# Patient Record
Sex: Female | Born: 1990 | Race: White | Hispanic: Yes | Marital: Single | Smoking: Current every day smoker
Health system: Southern US, Community
[De-identification: ages and names within clinical notes are randomized; demographics above are authoritative.]

## PROBLEM LIST (undated history)

## (undated) ENCOUNTER — Emergency Department (HOSPITAL_BASED_OUTPATIENT_CLINIC_OR_DEPARTMENT_OTHER): Admission: EM | Payer: 59 | Source: Home / Self Care

## (undated) DIAGNOSIS — E0590 Thyrotoxicosis, unspecified without thyrotoxic crisis or storm: Secondary | ICD-10-CM

## (undated) HISTORY — PX: CHOLECYSTECTOMY: SHX55

---
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
## 2010-11-17 LAB — URINALYSIS, ROUTINE W REFLEX MICROSCOPIC
Glucose, UA: NEGATIVE
Ketones, ur: NEGATIVE
Leukocytes, UA: NEGATIVE
Nitrite: NEGATIVE
Protein, ur: NEGATIVE
Specific Gravity, Urine: 1.025
Urobilinogen, UA: 0.2
pH: 6.5

## 2010-11-17 LAB — URINE MICROSCOPIC-ADD ON

## 2010-11-17 LAB — GC/CHLAMYDIA PROBE AMP, GENITAL
Chlamydia, DNA Probe: NEGATIVE
GC Probe Amp, Genital: NEGATIVE

## 2010-11-17 LAB — CBC
HCT: 36.4
Hemoglobin: 12.5
MCHC: 34.3
MCV: 80.1 — ABNORMAL LOW
Platelets: 203
RBC: 4.54
RDW: 14.1 — ABNORMAL HIGH
WBC: 5.5

## 2010-11-17 LAB — WET PREP, GENITAL
Clue Cells Wet Prep HPF POC: NONE SEEN
Trich, Wet Prep: NONE SEEN
Yeast Wet Prep HPF POC: NONE SEEN

## 2012-03-13 ENCOUNTER — Emergency Department (HOSPITAL_COMMUNITY)
Admission: EM | Admit: 2012-03-13 | Discharge: 2012-03-13 | Disposition: A | Discharge status: Home / Self Care | Payer: Self-pay

## 2012-03-13 NOTE — ED Notes (Signed)
Pt seen walking out with friend.

## 2012-03-13 NOTE — ED Notes (Signed)
Unable to locate patient for triage x3 

## 2012-03-13 NOTE — ED Notes (Signed)
No response when called for triage. 

## 2020-10-13 ENCOUNTER — Emergency Department (HOSPITAL_COMMUNITY)
Admission: EM | Admit: 2020-10-13 | Discharge: 2020-10-13 | Disposition: A | Discharge status: Home / Self Care | Payer: 59 | Attending: Emergency Medicine | Admitting: Emergency Medicine

## 2020-10-13 ENCOUNTER — Other Ambulatory Visit: Payer: Self-pay

## 2020-10-13 DIAGNOSIS — Z5321 Procedure and treatment not carried out due to patient leaving prior to being seen by health care provider: Secondary | ICD-10-CM | POA: Insufficient documentation

## 2020-10-13 DIAGNOSIS — K0889 Other specified disorders of teeth and supporting structures: Secondary | ICD-10-CM | POA: Diagnosis not present

## 2020-10-13 NOTE — ED Notes (Addendum)
Patient observed walking to bathroom.  Patient seen walking out of department by another staff member after using bathroom.  Patient did not inform this RN that she was leaving department without being seen

## 2020-10-13 NOTE — ED Triage Notes (Signed)
Pt complains of dental pain. Pt had her left wisdom teeth removed on Friday.

## 2020-12-01 ENCOUNTER — Other Ambulatory Visit: Payer: Self-pay

## 2020-12-01 ENCOUNTER — Ambulatory Visit
Admission: EM | Admit: 2020-12-01 | Discharge: 2020-12-01 | Disposition: A | Discharge status: Home / Self Care | Payer: 59 | Attending: Physician Assistant | Admitting: Physician Assistant

## 2020-12-01 ENCOUNTER — Encounter: Payer: Self-pay | Admitting: Physician Assistant

## 2020-12-01 DIAGNOSIS — J029 Acute pharyngitis, unspecified: Secondary | ICD-10-CM | POA: Diagnosis present

## 2020-12-01 LAB — POCT RAPID STREP A (OFFICE): Rapid Strep A Screen: NEGATIVE

## 2020-12-01 MED ORDER — AMOXICILLIN 500 MG PO CAPS: 500.0000 mg | ORAL_CAPSULE | Freq: Three times a day (TID) | ORAL | 0 refills | Status: DC

## 2020-12-01 NOTE — ED Triage Notes (Signed)
Pt c/o sore throat, swollen tonsils, headaches, fever, and chills x3 days.

## 2020-12-01 NOTE — ED Provider Notes (Signed)
[facility]    CSN: [id number] Arrival date & time: 12/01/20  0957      History   Chief Complaint Chief Complaint  Patient presents with   Sore Throat    HPI Jessica Harrington is a 30 y.o. female.   Patient here today for evaluation of sore throat, fever and chills that started 3 days ago. She denies vomiting or diarrhea. She has had minimal cough. She has tried warm liquids with mild relief.   The history is provided by the patient.  Sore Throat Pertinent negatives include no abdominal pain and no shortness of breath.   History reviewed. No pertinent past medical history.  There are no problems to display for this patient.   History reviewed. No pertinent surgical history.  OB History   No obstetric history on file.      Home Medications    Prior to Admission medications   Medication Sig Start Date End Date Taking? Authorizing Provider  amoxicillin (AMOXIL) 500 MG capsule Take 1 capsule (500 mg total) by mouth 3 (three) times daily. 12/01/20  Yes Tomi Bamberger, PA-C    Family History History reviewed. No pertinent family history.  Social History Social History   Tobacco Use   Smoking status: Every Day    Types: Cigarettes   Smokeless tobacco: Never  Substance Use Topics   Alcohol use: Not Currently   Drug use: Not Currently     Allergies   Patient has no known allergies.   Review of Systems Review of Systems  Constitutional:  Positive for chills and fever.  HENT:  Positive for congestion and sore throat. Negative for ear pain and sinus pressure.   Eyes:  Negative for discharge and redness.  Respiratory:  Positive for cough. Negative for shortness of breath and wheezing.   Gastrointestinal:  Negative for abdominal pain, diarrhea, nausea and vomiting.    Physical Exam Triage Vital Signs ED Triage Vitals [12/01/20 1056]  Enc Vitals Group     BP 125/79     Pulse Rate 81     Resp 18     Temp 99.1 F (37.3 C)     Temp  Source Oral     SpO2 98 %     Weight      Height      Head Circumference      Peak Flow      Pain Score      Pain Loc      Pain Edu?      Excl. in GC?    No data found.  Updated Vital Signs BP 125/79 (BP Location: Left Arm)   Pulse 81   Temp 99.1 F (37.3 C) (Oral)   Resp 18   LMP 12/01/2020   SpO2 98%   Physical Exam Vitals and nursing note reviewed.  Constitutional:      General: She is not in acute distress.    Appearance: Normal appearance. She is not ill-appearing.  HENT:     Head: Normocephalic and atraumatic.     Right Ear: Tympanic membrane normal.     Left Ear: Tympanic membrane normal.     Nose: Congestion present.     Mouth/Throat:     Mouth: Mucous membranes are moist.     Pharynx: Oropharyngeal exudate and posterior oropharyngeal erythema present.  Eyes:     Conjunctiva/sclera: Conjunctivae normal.  Cardiovascular:     Rate and Rhythm: Normal rate and regular rhythm.     Heart sounds: Normal  heart sounds. No murmur heard. Pulmonary:     Effort: Pulmonary effort is normal. No respiratory distress.     Breath sounds: Normal breath sounds. No wheezing, rhonchi or rales.  Skin:    General: Skin is warm and dry.  Neurological:     Mental Status: She is alert.  Psychiatric:        Mood and Affect: Mood normal.        Thought Content: Thought content normal.     UC Treatments / Results  Labs (all labs ordered are listed, but only abnormal results are displayed) Labs Reviewed  COVID-19, FLU A+B NAA  CULTURE, GROUP A STREP [facility])  POCT RAPID STREP A (OFFICE)    EKG   Radiology No results found.  Procedures Procedures (including critical care time)  Medications Ordered in UC Medications - No data to display  Initial Impression / Assessment and Plan / UC Course  I have reviewed the triage vital signs and the nursing notes.  Pertinent labs & imaging results that were available during my care of the patient were reviewed by me and  considered in my medical decision making (see chart for details).   Rapid strep negative but given exudate will treat with antibiotics. Screening ordered for flu and covid as well. Recommend follow up with any further concerns. Will await results for further recommendation.   Final Clinical Impressions(s) / UC Diagnoses   Final diagnoses:  Acute pharyngitis, unspecified etiology   Discharge Instructions   None    ED Prescriptions     Medication Sig Dispense Auth. Provider   amoxicillin (AMOXIL) 500 MG capsule Take 1 capsule (500 mg total) by mouth 3 (three) times daily. 21 capsule Tomi Bamberger, PA-C      PDMP not reviewed this encounter.   Tomi Bamberger, PA-C 12/01/20 1138

## 2020-12-03 LAB — COVID-19, FLU A+B NAA
Influenza A, NAA: NOT DETECTED
Influenza B, NAA: NOT DETECTED
SARS-CoV-2, NAA: NOT DETECTED

## 2020-12-04 LAB — CULTURE, GROUP A STREP (THRC)

## 2020-12-20 ENCOUNTER — Telehealth: Payer: Self-pay | Admitting: Emergency Medicine

## 2020-12-20 ENCOUNTER — Ambulatory Visit: Payer: 59

## 2020-12-20 NOTE — Telephone Encounter (Signed)
Call to pt to see how low her BP was  ( systolic was 132). Pt states nipples are sore & feels dizzy & nauseated. LMP was 12/01/20. Pt plans on doing an at home pregnancy test today. RN asked pt to come in earlier if able to in case she needs to go to the hospital.

## 2021-02-11 DIAGNOSIS — B379 Candidiasis, unspecified: Secondary | ICD-10-CM | POA: Diagnosis not present

## 2021-02-11 DIAGNOSIS — R350 Frequency of micturition: Secondary | ICD-10-CM | POA: Diagnosis not present

## 2021-04-24 DIAGNOSIS — N898 Other specified noninflammatory disorders of vagina: Secondary | ICD-10-CM | POA: Diagnosis not present

## 2021-04-24 DIAGNOSIS — R350 Frequency of micturition: Secondary | ICD-10-CM | POA: Diagnosis not present

## 2021-04-24 DIAGNOSIS — N730 Acute parametritis and pelvic cellulitis: Secondary | ICD-10-CM | POA: Diagnosis not present

## 2021-06-05 ENCOUNTER — Other Ambulatory Visit: Payer: Self-pay

## 2021-06-05 ENCOUNTER — Emergency Department (HOSPITAL_BASED_OUTPATIENT_CLINIC_OR_DEPARTMENT_OTHER)
Admission: EM | Admit: 2021-06-05 | Discharge: 2021-06-06 | Disposition: A | Discharge status: Home / Self Care | Payer: 59 | Attending: Emergency Medicine | Admitting: Emergency Medicine

## 2021-06-05 ENCOUNTER — Encounter (HOSPITAL_BASED_OUTPATIENT_CLINIC_OR_DEPARTMENT_OTHER): Payer: Self-pay

## 2021-06-05 DIAGNOSIS — M791 Myalgia, unspecified site: Secondary | ICD-10-CM | POA: Diagnosis not present

## 2021-06-05 DIAGNOSIS — U071 COVID-19: Secondary | ICD-10-CM | POA: Diagnosis not present

## 2021-06-05 LAB — CBC WITH DIFFERENTIAL/PLATELET
Abs Immature Granulocytes: 0.01 10*3/uL (ref 0.00–0.07)
Basophils Absolute: 0 10*3/uL (ref 0.0–0.1)
Basophils Relative: 1 %
Eosinophils Absolute: 0 10*3/uL (ref 0.0–0.5)
Eosinophils Relative: 1 %
HCT: 42.7 % (ref 36.0–46.0)
Hemoglobin: 13.8 g/dL (ref 12.0–15.0)
Immature Granulocytes: 0 %
Lymphocytes Relative: 19 %
Lymphs Abs: 0.8 10*3/uL (ref 0.7–4.0)
MCH: 27.2 pg (ref 26.0–34.0)
MCHC: 32.3 g/dL (ref 30.0–36.0)
MCV: 84.1 fL (ref 80.0–100.0)
Monocytes Absolute: 0.5 10*3/uL (ref 0.1–1.0)
Monocytes Relative: 13 %
Neutro Abs: 2.7 10*3/uL (ref 1.7–7.7)
Neutrophils Relative %: 66 %
Platelets: 236 10*3/uL (ref 150–400)
RBC: 5.08 MIL/uL (ref 3.87–5.11)
RDW: 12.8 % (ref 11.5–15.5)
WBC: 4 10*3/uL (ref 4.0–10.5)
nRBC: 0 % (ref 0.0–0.2)

## 2021-06-05 LAB — URINALYSIS, ROUTINE W REFLEX MICROSCOPIC
Bilirubin Urine: NEGATIVE
Glucose, UA: NEGATIVE mg/dL
Ketones, ur: NEGATIVE mg/dL
Leukocytes,Ua: NEGATIVE
Nitrite: NEGATIVE
Protein, ur: NEGATIVE mg/dL
Specific Gravity, Urine: 1.016 (ref 1.005–1.030)
pH: 5 (ref 5.0–8.0)

## 2021-06-05 LAB — RESP PANEL BY RT-PCR (FLU A&B, COVID) ARPGX2
Influenza A by PCR: NEGATIVE
Influenza B by PCR: NEGATIVE
SARS Coronavirus 2 by RT PCR: POSITIVE — AB

## 2021-06-05 LAB — COMPREHENSIVE METABOLIC PANEL
ALT: 28 U/L (ref 0–44)
AST: 23 U/L (ref 15–41)
Albumin: 4.6 g/dL (ref 3.5–5.0)
Alkaline Phosphatase: 64 U/L (ref 38–126)
Anion gap: 10 (ref 5–15)
BUN: 16 mg/dL (ref 6–20)
CO2: 26 mmol/L (ref 22–32)
Calcium: 10.1 mg/dL (ref 8.9–10.3)
Chloride: 100 mmol/L (ref 98–111)
Creatinine, Ser: 1 mg/dL (ref 0.44–1.00)
GFR, Estimated: >60 mL/min (ref >60)
Glucose, Bld: 100 mg/dL — ABNORMAL HIGH (ref 70–99)
Potassium: 3.7 mmol/L (ref 3.5–5.1)
Sodium: 136 mmol/L (ref 135–145)
Total Bilirubin: 0.3 mg/dL (ref 0.3–1.2)
Total Protein: 7.9 g/dL (ref 6.5–8.1)

## 2021-06-05 LAB — LACTIC ACID, PLASMA: Lactic Acid, Venous: 0.6 mmol/L (ref 0.5–1.9)

## 2021-06-05 LAB — PREGNANCY, URINE: Preg Test, Ur: NEGATIVE

## 2021-06-05 MED ORDER — ACETAMINOPHEN 325 MG PO TABS
650.0000 mg | ORAL_TABLET | Freq: Once | ORAL | Status: AC
  Administered 2021-06-06: 650 mg via ORAL
  Filled 2021-06-05: qty 2

## 2021-06-05 NOTE — ED Triage Notes (Signed)
Presented via triage with c/o HA, generalized body aches and weakness x2 days.  ? ?Denies n/v ?

## 2021-06-05 NOTE — ED Provider Notes (Signed)
? ?  [place] [facility]  ?Provider Note ? ?CSN: [id number] ?Arrival date & time: 06/05/21 2037 ? ?History ?Chief Complaint  ?Patient presents with  ? Generalized Body Aches  ? ? ?Jessica Harrington is a 31 y.o. female with no PMH reports 2 days of body aches, chills, headaches but no cough, SOB, fever or vomiting.  ? ? ?Home Medications ?Prior to Admission medications   ?Medication Sig Start Date End Date Taking? Authorizing Provider  ?amoxicillin (AMOXIL) 500 MG capsule Take 1 capsule (500 mg total) by mouth 3 (three) times daily. 12/01/20   Francene Finders, PA-C  ? ? ? ?Allergies    ?Patient has no known allergies. ? ? ?Review of Systems   ?Review of Systems ?Please see HPI for pertinent positives and negatives ? ?Physical Exam ?BP 99/76   Pulse 80   Temp 98.7 ?F (37.1 ?C) (Oral)   Resp 18   Ht 5\' 6"  (1.676 m)   Wt 104.3 kg   SpO2 99%   BMI 37.12 kg/m?  ? ?Physical Exam ?Vitals and nursing note reviewed.  ?HENT:  ?   Head: Normocephalic.  ?   Nose: Nose normal.  ?Eyes:  ?   Extraocular Movements: Extraocular movements intact.  ?Pulmonary:  ?   Effort: Pulmonary effort is normal.  ?Musculoskeletal:     ?   General: Normal range of motion.  ?   Cervical back: Neck supple.  ?Skin: ?   Findings: No rash (on exposed skin).  ?Neurological:  ?   Mental Status: She is alert and oriented to person, place, and time.  ?Psychiatric:     ?   Mood and Affect: Mood normal.  ? ? ?ED Results / Procedures / Treatments   ?EKG ?None ? ?Procedures ?Procedures ? ?Medications Ordered in the ED ?Medications  ?acetaminophen (TYLENOL) tablet 650 mg (has no administration in time range)  ? ? ?Initial Impression and Plan ? Patient well appearing with viral syndrome symptoms, labs done in triage show normal CBC, BMP, UA and lactic acid. Covid is positive. She does not have any high risk medical problems. Not likely to benefit from Paxlovid. Normal respiratory status without hypoxia, does not meet criteria for  admission. Recommend supportive treatment at home. APAP/Motrin for aches and pains. Oral Hydration, PCP follow up. Quarantine instructions given.  ? ?ED Course  ? ?  ? ? ?MDM Rules/Calculators/A&P ?Medical Decision Making ?Problems Addressed: ?COVID: acute illness or injury ? ?Amount and/or Complexity of Data Reviewed ?Labs: ordered. Decision-making details documented in ED Course. ? ?Risk ?OTC drugs. ?Decision regarding hospitalization. ? ? ? ?Final Clinical Impression(s) / ED Diagnoses ?Final diagnoses:  ?COVID  ? ? ?Rx / DC Orders ?ED Discharge Orders   ? ? None  ? ?  ? ?  ?Truddie Hidden, MD ?06/05/21 2342 ? ?

## 2021-06-06 NOTE — ED Notes (Signed)
Pt agreeable with d/c plan as discussed by provider-this nurse has verbally reinforced d/c instructions and provided pt with written copy- pt acknowledges verbal understanding and denies any additional questions, concerns, needs- pt ambulatory with steady gait at discharge; no distress; vitals stable  ?

## 2021-06-08 DIAGNOSIS — Z20822 Contact with and (suspected) exposure to covid-19: Secondary | ICD-10-CM | POA: Diagnosis not present

## 2021-06-10 DIAGNOSIS — Z20822 Contact with and (suspected) exposure to covid-19: Secondary | ICD-10-CM | POA: Diagnosis not present

## 2021-06-16 ENCOUNTER — Other Ambulatory Visit (HOSPITAL_BASED_OUTPATIENT_CLINIC_OR_DEPARTMENT_OTHER): Payer: Self-pay

## 2021-06-16 ENCOUNTER — Emergency Department (HOSPITAL_BASED_OUTPATIENT_CLINIC_OR_DEPARTMENT_OTHER)
Admission: EM | Admit: 2021-06-16 | Discharge: 2021-06-16 | Disposition: A | Discharge status: Home / Self Care | Payer: 59 | Attending: Emergency Medicine | Admitting: Emergency Medicine

## 2021-06-16 ENCOUNTER — Encounter (HOSPITAL_BASED_OUTPATIENT_CLINIC_OR_DEPARTMENT_OTHER): Payer: Self-pay | Admitting: Emergency Medicine

## 2021-06-16 ENCOUNTER — Other Ambulatory Visit: Payer: Self-pay

## 2021-06-16 ENCOUNTER — Emergency Department (HOSPITAL_BASED_OUTPATIENT_CLINIC_OR_DEPARTMENT_OTHER): Payer: 59

## 2021-06-16 DIAGNOSIS — R197 Diarrhea, unspecified: Secondary | ICD-10-CM | POA: Diagnosis not present

## 2021-06-16 DIAGNOSIS — K529 Noninfective gastroenteritis and colitis, unspecified: Secondary | ICD-10-CM

## 2021-06-16 DIAGNOSIS — N9489 Other specified conditions associated with female genital organs and menstrual cycle: Secondary | ICD-10-CM | POA: Diagnosis not present

## 2021-06-16 DIAGNOSIS — R519 Headache, unspecified: Secondary | ICD-10-CM | POA: Insufficient documentation

## 2021-06-16 DIAGNOSIS — R112 Nausea with vomiting, unspecified: Secondary | ICD-10-CM | POA: Diagnosis not present

## 2021-06-16 DIAGNOSIS — R109 Unspecified abdominal pain: Secondary | ICD-10-CM | POA: Diagnosis not present

## 2021-06-16 LAB — CBC WITH DIFFERENTIAL/PLATELET
Abs Immature Granulocytes: 0.02 10*3/uL (ref 0.00–0.07)
Basophils Absolute: 0 10*3/uL (ref 0.0–0.1)
Basophils Relative: 0 %
Eosinophils Absolute: 0.1 10*3/uL (ref 0.0–0.5)
Eosinophils Relative: 1 %
HCT: 41.9 % (ref 36.0–46.0)
Hemoglobin: 13.5 g/dL (ref 12.0–15.0)
Immature Granulocytes: 0 %
Lymphocytes Relative: 8 %
Lymphs Abs: 0.6 10*3/uL — ABNORMAL LOW (ref 0.7–4.0)
MCH: 26.7 pg (ref 26.0–34.0)
MCHC: 32.2 g/dL (ref 30.0–36.0)
MCV: 82.8 fL (ref 80.0–100.0)
Monocytes Absolute: 0.2 10*3/uL (ref 0.1–1.0)
Monocytes Relative: 3 %
Neutro Abs: 6.4 10*3/uL (ref 1.7–7.7)
Neutrophils Relative %: 88 %
Platelets: 221 10*3/uL (ref 150–400)
RBC: 5.06 MIL/uL (ref 3.87–5.11)
RDW: 12.6 % (ref 11.5–15.5)
WBC: 7.3 10*3/uL (ref 4.0–10.5)
nRBC: 0 % (ref 0.0–0.2)

## 2021-06-16 LAB — HCG, SERUM, QUALITATIVE: Preg, Serum: NEGATIVE

## 2021-06-16 LAB — URINALYSIS, ROUTINE W REFLEX MICROSCOPIC
Bilirubin Urine: NEGATIVE
Glucose, UA: NEGATIVE mg/dL
Leukocytes,Ua: NEGATIVE
Nitrite: NEGATIVE
Specific Gravity, Urine: 1.033 — ABNORMAL HIGH (ref 1.005–1.030)
pH: 5.5 (ref 5.0–8.0)

## 2021-06-16 LAB — COMPREHENSIVE METABOLIC PANEL
ALT: 36 U/L (ref 0–44)
AST: 26 U/L (ref 15–41)
Albumin: 4 g/dL (ref 3.5–5.0)
Alkaline Phosphatase: 55 U/L (ref 38–126)
Anion gap: 8 (ref 5–15)
BUN: 16 mg/dL (ref 6–20)
CO2: 26 mmol/L (ref 22–32)
Calcium: 8.9 mg/dL (ref 8.9–10.3)
Chloride: 103 mmol/L (ref 98–111)
Creatinine, Ser: 1.04 mg/dL — ABNORMAL HIGH (ref 0.44–1.00)
GFR, Estimated: >60 mL/min (ref >60)
Glucose, Bld: 97 mg/dL (ref 70–99)
Potassium: 3.9 mmol/L (ref 3.5–5.1)
Sodium: 137 mmol/L (ref 135–145)
Total Bilirubin: 0.8 mg/dL (ref 0.3–1.2)
Total Protein: 6.8 g/dL (ref 6.5–8.1)

## 2021-06-16 LAB — LIPASE, BLOOD: Lipase: 11 U/L (ref 11–51)

## 2021-06-16 MED ORDER — METOCLOPRAMIDE HCL 5 MG/ML IJ SOLN
10.0000 mg | Freq: Once | INTRAMUSCULAR | Status: AC
  Administered 2021-06-16: 10 mg via INTRAVENOUS
  Filled 2021-06-16: qty 2

## 2021-06-16 MED ORDER — PANTOPRAZOLE SODIUM 40 MG IV SOLR
40.0000 mg | Freq: Once | INTRAVENOUS | Status: AC
  Administered 2021-06-16: 40 mg via INTRAVENOUS
  Filled 2021-06-16: qty 10

## 2021-06-16 MED ORDER — ACETAMINOPHEN 325 MG PO TABS
650.0000 mg | ORAL_TABLET | Freq: Once | ORAL | Status: AC
  Administered 2021-06-16: 650 mg via ORAL
  Filled 2021-06-16: qty 2

## 2021-06-16 MED ORDER — SODIUM CHLORIDE 0.9 % IV BOLUS
1000.0000 mL | Freq: Once | INTRAVENOUS | Status: AC
  Administered 2021-06-16: 1000 mL via INTRAVENOUS

## 2021-06-16 MED ORDER — SODIUM CHLORIDE 0.9 % IV SOLN: INTRAVENOUS | Status: DC

## 2021-06-16 MED ORDER — DEXAMETHASONE SODIUM PHOSPHATE 10 MG/ML IJ SOLN
10.0000 mg | Freq: Once | INTRAMUSCULAR | Status: AC
  Administered 2021-06-16: 10 mg via INTRAVENOUS
  Filled 2021-06-16: qty 1

## 2021-06-16 MED ORDER — LACTATED RINGERS IV BOLUS
1000.0000 mL | Freq: Once | INTRAVENOUS | Status: AC
  Administered 2021-06-16: 1000 mL via INTRAVENOUS

## 2021-06-16 MED ORDER — ONDANSETRON HCL 4 MG/2ML IJ SOLN
4.0000 mg | Freq: Once | INTRAMUSCULAR | Status: AC
  Administered 2021-06-16: 4 mg via INTRAVENOUS
  Filled 2021-06-16: qty 2

## 2021-06-16 MED ORDER — DIPHENHYDRAMINE HCL 50 MG/ML IJ SOLN
25.0000 mg | Freq: Once | INTRAMUSCULAR | Status: AC
  Administered 2021-06-16: 25 mg via INTRAVENOUS
  Filled 2021-06-16: qty 1

## 2021-06-16 MED ORDER — ONDANSETRON 4 MG PO TBDP
4.0000 mg | ORAL_TABLET | Freq: Three times a day (TID) | ORAL | 0 refills | Status: DC | PRN
  Filled 2021-06-16: qty 14, 5d supply, fill #0

## 2021-06-16 MED ORDER — ONDANSETRON 4 MG PO TBDP
4.0000 mg | ORAL_TABLET | Freq: Once | ORAL | Status: AC
  Administered 2021-06-16: 4 mg via ORAL
  Filled 2021-06-16: qty 1

## 2021-06-16 MED ORDER — IOHEXOL 300 MG/ML  SOLN
100.0000 mL | Freq: Once | INTRAMUSCULAR | Status: AC | PRN
  Administered 2021-06-16: 100 mL via INTRAVENOUS

## 2021-06-16 NOTE — ED Provider Notes (Addendum)
Patient turned over to me.  Patient's labs very reassuring.  Urine was a little concentrated.  Patient is already received 1 L of fluid.  Urinalysis not consistent with urinary tract infection.  Complete metabolic panel normal GFR greater than 60 no anion gap lipase normal liver function test normal white blood cell count 7.3 hemoglobin 13.5 regnancy test negative. ? ?Since I have taken over patient's care she has had additional multiple complaints says that she has had a headache for 3 days that is not typical for her migraines.  It was better yesterday but it came back worse when she started with the nausea vomiting and diarrhea last evening at around 1800.  Patient still complaining of generalized abdominal pain all over that is severe at times. ? ?Clinically this seems to be consistent with a gastroenteritis type viral illness.  But patient is insistent. ? ?Based on this we will give another liter of fluid we will give a migraine cocktail with Decadron Benadryl and Reglan.  And we will go ahead and CT her head abdomen and pelvis. ?  ?Fredia Sorrow, MD ?06/16/21 0912 ? ? ?CT head CT abdomen pelvis without any acute findings.  Patient feeling better with the migraine cocktail.  Will discharge home with antinausea medicine.  Patient does not need work note.  Clinically still feel that this is a viral gastroenteritis. ?  ?Fredia Sorrow, MD ?06/16/21 1037 ? ?

## 2021-06-16 NOTE — ED Provider Notes (Signed)
? ?[facility]  ?Provider Note ? ?CSN: [id number] ?Arrival date & time: 06/16/21 0240 ? ?History ?Chief Complaint  ?Patient presents with  ?? Emesis  ? ? ?Jessica Harrington is a 31 y.o. female with no significant PMH, has had cholecystectomy, reports she has had nausea, vomiting, diarrhea and left sided abdominal burning since eating some fish last night for dinner. Husband had some GI symptoms last week. She denies fever but had some chills. Feeling weak.  ? ? ?Home Medications ?Prior to Admission medications   ?Not on File  ? ? ? ?Allergies    ?Patient has no known allergies. ? ? ?Review of Systems   ?Review of Systems ?Please see HPI for pertinent positives and negatives ? ?Physical Exam ?BP 119/62   Pulse 84   Temp 98.6 ?F (37 ?C)   Resp 18   Ht 5\' 6"  (1.676 m)   Wt 100.7 kg   SpO2 99%   BMI 35.83 kg/m?  ? ?Physical Exam ?Vitals and nursing note reviewed.  ?Constitutional:   ?   Appearance: Normal appearance.  ?HENT:  ?   Head: Normocephalic and atraumatic.  ?   Nose: Nose normal.  ?   Mouth/Throat:  ?   Mouth: Mucous membranes are moist.  ?Eyes:  ?   Extraocular Movements: Extraocular movements intact.  ?   Conjunctiva/sclera: Conjunctivae normal.  ?Cardiovascular:  ?   Rate and Rhythm: Normal rate.  ?Pulmonary:  ?   Effort: Pulmonary effort is normal.  ?   Breath sounds: Normal breath sounds.  ?Abdominal:  ?   General: Abdomen is flat.  ?   Palpations: Abdomen is soft. There is no mass.  ?   Tenderness: There is no abdominal tenderness. There is no guarding.  ?Musculoskeletal:     ?   General: No swelling. Normal range of motion.  ?   Cervical back: Neck supple.  ?Skin: ?   General: Skin is warm and dry.  ?Neurological:  ?   General: No focal deficit present.  ?   Mental Status: She is alert.  ?Psychiatric:     ?   Mood and Affect: Mood normal.  ? ? ?ED Results / Procedures / Treatments   ?EKG ?None ? ?Procedures ?Procedures ? ?Medications Ordered in the ED ?Medications   ?lactated ringers bolus 1,000 mL (has no administration in time range)  ?ondansetron (ZOFRAN) injection 4 mg (has no administration in time range)  ?pantoprazole (PROTONIX) injection 40 mg (has no administration in time range)  ?ondansetron (ZOFRAN-ODT) disintegrating tablet 4 mg (4 mg Oral Given 06/16/21 08/16/21)  ? ? ?Initial Impression and Plan ? Patient with N/V/D, she is unsure if she's pregnant but doesn't think so. She has normal vitals and a benign abdomen. Will try a ODT Zofran with PO fluids, if she does not tolerate, may need IVF.  ? ?ED Course  ? ?Clinical Course as of 06/16/21 0705  ?Tue Jun 16, 2021  ?Jun 18, 2021 Patient still having significant nausea. Will order labs, place IV for fluids and care will be signed out to the oncoming team at shift change. [CS]  ?3299 CBC is normal.  [CS]  ?  ?Clinical Course User Index ?[CS] 2426, MD  ? ? ? ?MDM Rules/Calculators/A&P ?Medical Decision Making ?Problems Addressed: ?Nausea vomiting and diarrhea: acute illness or injury ? ?Amount and/or Complexity of Data Reviewed ?Labs: ordered. ? ?Risk ?Prescription drug management. ? ? ? ?Final Clinical Impression(s) / ED Diagnoses ?Final diagnoses:  ?Nausea vomiting  and diarrhea  ? ? ?Rx / DC Orders ?ED Discharge Orders   ? ? None  ? ?  ? ?  ?Pollyann Savoy, MD ?06/16/21 [phone] ? ?

## 2021-06-16 NOTE — ED Triage Notes (Signed)
Pt states that she thinks she ate something bad and has been having abdominal pain, N/V/D since.  ?

## 2021-06-16 NOTE — Discharge Instructions (Signed)
Would recommend just clear liquids today rest in a dark place.  Take the Zofran as needed for nausea and vomiting.  Once tolerating clear liquids well and liquids can have sugar in them would advance to a bland diet.  Return for any new or worse symptoms.  Would expect significant improvement by tomorrow. ?

## 2021-06-16 NOTE — ED Notes (Signed)
Pt states that she is unable to give a urine sample at this time 

## 2021-06-16 NOTE — ED Notes (Signed)
Discharge instructions, follow up care, and prescriptions reviewed and explained, pt verbalized understanding. Pt caox4 and ambulatory on departure.  

## 2021-07-16 ENCOUNTER — Ambulatory Visit (HOSPITAL_BASED_OUTPATIENT_CLINIC_OR_DEPARTMENT_OTHER): Payer: Self-pay | Admitting: Nurse Practitioner

## 2021-07-17 ENCOUNTER — Other Ambulatory Visit: Payer: Self-pay

## 2021-07-17 NOTE — ED Notes (Signed)
Pt called again no answer 

## 2021-08-24 DIAGNOSIS — L309 Dermatitis, unspecified: Secondary | ICD-10-CM | POA: Diagnosis not present

## 2021-08-28 ENCOUNTER — Ambulatory Visit (HOSPITAL_BASED_OUTPATIENT_CLINIC_OR_DEPARTMENT_OTHER): Payer: 59 | Admitting: Nurse Practitioner

## 2021-09-01 ENCOUNTER — Encounter (HOSPITAL_BASED_OUTPATIENT_CLINIC_OR_DEPARTMENT_OTHER): Payer: Self-pay | Admitting: Nurse Practitioner

## 2021-09-01 DIAGNOSIS — J039 Acute tonsillitis, unspecified: Secondary | ICD-10-CM | POA: Diagnosis not present

## 2021-12-02 ENCOUNTER — Emergency Department (HOSPITAL_BASED_OUTPATIENT_CLINIC_OR_DEPARTMENT_OTHER)
Admission: EM | Admit: 2021-12-02 | Discharge: 2021-12-02 | Disposition: A | Discharge status: Home / Self Care | Payer: Medicaid Other | Attending: Emergency Medicine | Admitting: Emergency Medicine

## 2021-12-02 ENCOUNTER — Encounter (HOSPITAL_BASED_OUTPATIENT_CLINIC_OR_DEPARTMENT_OTHER): Payer: Self-pay

## 2021-12-02 ENCOUNTER — Other Ambulatory Visit: Payer: Self-pay

## 2021-12-02 ENCOUNTER — Emergency Department (HOSPITAL_BASED_OUTPATIENT_CLINIC_OR_DEPARTMENT_OTHER): Payer: Medicaid Other

## 2021-12-02 DIAGNOSIS — O039 Complete or unspecified spontaneous abortion without complication: Secondary | ICD-10-CM | POA: Diagnosis present

## 2021-12-02 HISTORY — DX: Thyrotoxicosis, unspecified without thyrotoxic crisis or storm: E05.90

## 2021-12-02 LAB — CBC WITH DIFFERENTIAL/PLATELET
Abs Immature Granulocytes: 0.03 10*3/uL (ref 0.00–0.07)
Basophils Absolute: 0 10*3/uL (ref 0.0–0.1)
Basophils Relative: 0 %
Eosinophils Absolute: 0.1 10*3/uL (ref 0.0–0.5)
Eosinophils Relative: 1 %
HCT: 40.9 % (ref 36.0–46.0)
Hemoglobin: 13.1 g/dL (ref 12.0–15.0)
Immature Granulocytes: 0 %
Lymphocytes Relative: 18 %
Lymphs Abs: 1.3 10*3/uL (ref 0.7–4.0)
MCH: 27.3 pg (ref 26.0–34.0)
MCHC: 32 g/dL (ref 30.0–36.0)
MCV: 85.2 fL (ref 80.0–100.0)
Monocytes Absolute: 0.4 10*3/uL (ref 0.1–1.0)
Monocytes Relative: 6 %
Neutro Abs: 5.2 10*3/uL (ref 1.7–7.7)
Neutrophils Relative %: 75 %
Platelets: 260 10*3/uL (ref 150–400)
RBC: 4.8 MIL/uL (ref 3.87–5.11)
RDW: 12.8 % (ref 11.5–15.5)
WBC: 7 10*3/uL (ref 4.0–10.5)
nRBC: 0 % (ref 0.0–0.2)

## 2021-12-02 LAB — COMPREHENSIVE METABOLIC PANEL
ALT: 17 U/L (ref 0–44)
AST: 14 U/L — ABNORMAL LOW (ref 15–41)
Albumin: 4 g/dL (ref 3.5–5.0)
Alkaline Phosphatase: 62 U/L (ref 38–126)
Anion gap: 9 (ref 5–15)
BUN: 10 mg/dL (ref 6–20)
CO2: 25 mmol/L (ref 22–32)
Calcium: 9.2 mg/dL (ref 8.9–10.3)
Chloride: 101 mmol/L (ref 98–111)
Creatinine, Ser: 0.93 mg/dL (ref 0.44–1.00)
GFR, Estimated: >60 mL/min (ref >60)
Glucose, Bld: 109 mg/dL — ABNORMAL HIGH (ref 70–99)
Potassium: 3.8 mmol/L (ref 3.5–5.1)
Sodium: 135 mmol/L (ref 135–145)
Total Bilirubin: 0.2 mg/dL — ABNORMAL LOW (ref 0.3–1.2)
Total Protein: 7.1 g/dL (ref 6.5–8.1)

## 2021-12-02 LAB — WET PREP, GENITAL
Clue Cells Wet Prep HPF POC: NONE SEEN
Sperm: NONE SEEN
Trich, Wet Prep: NONE SEEN
WBC, Wet Prep HPF POC: <10 (ref <10)
Yeast Wet Prep HPF POC: NONE SEEN

## 2021-12-02 LAB — ABO/RH: ABO/RH(D): O POS

## 2021-12-02 LAB — HCG, QUANTITATIVE, PREGNANCY: hCG, Beta Chain, Quant, S: 68300 m[IU]/mL — ABNORMAL HIGH (ref <5)

## 2021-12-02 MED ORDER — ONDANSETRON 4 MG PO TBDP
4.0000 mg | ORAL_TABLET | Freq: Once | ORAL | Status: AC
  Administered 2021-12-02: 4 mg via ORAL
  Filled 2021-12-02: qty 1

## 2021-12-02 MED ORDER — ACETAMINOPHEN 500 MG PO TABS
1000.0000 mg | ORAL_TABLET | Freq: Once | ORAL | Status: AC
  Administered 2021-12-02: 1000 mg via ORAL
  Filled 2021-12-02: qty 2

## 2021-12-02 NOTE — ED Provider Notes (Signed)
[facility] Provider Note   CSN: [id number] Arrival date & time: 12/02/21  1814     History  Chief Complaint  Patient presents with   Vaginal Bleeding    Jessica Harrington is a 31 y.o. female.  HPI 31 year old female presents with concern for vaginal bleeding during pregnancy.  She is concerned she is having a miscarriage.  She is approximately 9-[redacted] weeks pregnant though does not know her LMP.  She has been feeling "weird" since finding out she is pregnant but now starting to have lower abdominal pain and today started having bleeding with clots and then about 30 minutes prior to me seeing her noticed tissue come out.  She states it looks like when she had a miscarriage before.  She still having some lower abdominal pain.  She had some chills today as well.  Home Medications Prior to Admission medications   Medication Sig Start Date End Date Taking? Authorizing Provider  ondansetron (ZOFRAN-ODT) 4 MG disintegrating tablet Dissolve 1 tablet under the tongue every 8 (eight) hours as needed for nausea or vomiting. 06/16/21   Fredia Sorrow, MD  phentermine (ADIPEX-P) 37.5 MG tablet Take 37.5 mg by mouth daily. 05/31/21   [provider]      Allergies    Patient has no known allergies.    Review of Systems   Review of Systems  Constitutional:  Positive for chills. Negative for fever.  Gastrointestinal:  Positive for abdominal pain.  Genitourinary:  Positive for vaginal bleeding.    Physical Exam Updated Vital Signs BP (!) 101/51 (BP Location: Right Arm)   Pulse 68   Temp 98.8 F (37.1 C) (Oral)   Resp 16   Ht 5\' 6"  (1.676 m)   Wt 95.7 kg   LMP 10/16/2021   SpO2 99%   BMI 34.06 kg/m  Physical Exam Vitals and nursing note reviewed. Exam conducted with a chaperone present.  Constitutional:      General: She is not in acute distress.    Appearance: She is well-developed. She is not ill-appearing or diaphoretic.  HENT:     Head:  Normocephalic and atraumatic.  Cardiovascular:     Rate and Rhythm: Normal rate and regular rhythm.     Heart sounds: Normal heart sounds.  Pulmonary:     Effort: Pulmonary effort is normal.     Breath sounds: Normal breath sounds.  Abdominal:     Palpations: Abdomen is soft.     Tenderness: There is abdominal tenderness in the right lower quadrant, suprapubic area and left lower quadrant.  Genitourinary:    Comments: No obvious masses/fetal tissue.  There is a moderate amount of blood without significant blood clots.  Unable to get a good view of the cervix. Skin:    General: Skin is warm and dry.  Neurological:     Mental Status: She is alert.     ED Results / Procedures / Treatments   Labs (all labs ordered are listed, but only abnormal results are displayed) Labs Reviewed  HCG, QUANTITATIVE, PREGNANCY - Abnormal; Notable for the following components:      Result Value   hCG, Beta Chain, Quant, S 68,300 (*)    All other components within normal limits  COMPREHENSIVE METABOLIC PANEL - Abnormal; Notable for the following components:   Glucose, Bld 109 (*)    AST 14 (*)    Total Bilirubin 0.2 (*)    All other components within normal limits  WET PREP, GENITAL  CBC  WITH DIFFERENTIAL/PLATELET  ABO/RH  GC/CHLAMYDIA PROBE AMP ([facility]) NOT AT [facility]    EKG None  Radiology US OB LESS THAN 14 WEEKS WITH OB TRANSVAGINAL  Result Date: 12/02/2021 CLINICAL DATA:  Vaginal bleeding positive pregnancy test EXAM: OBSTETRIC <14 WK [place] AND TRANSVAGINAL OB US TECHNIQUE: Both transabdominal and transvaginal ultrasound examinations were performed for complete evaluation of the gestation as well as the maternal uterus, adnexal regions, and pelvic cul-de-sac. Transvaginal technique was performed to assess early pregnancy. COMPARISON:  None Available. FINDINGS: Intrauterine gestational sac: Absent Maternal uterus/adnexae: Ovaries are within normal limits. Uterus demonstrates some  heterogeneity of the endometrium in the lower uterine segment. This also demonstrates increased vascularity. Mild free fluid is noted. IMPRESSION: No evidence of intrauterine or extrauterine gestational sac. This may represent a very early pregnancy. Correlation with beta HCG levels is recommended. The need for repeat ultrasound can be determined on a clinical basis. Heterogeneous somewhat hypervascular endometrium in the lower uterine segment just above the cervix. This is of uncertain etiology. Electronically Signed   By: Alcide Clever M.D.   On: 12/02/2021 23:14    Procedures Procedures    Medications Ordered in ED Medications  acetaminophen (TYLENOL) tablet 1,000 mg (1,000 mg Oral Given 12/02/21 2038)  ondansetron (ZOFRAN-ODT) disintegrating tablet 4 mg (4 mg Oral Given 12/02/21 2256)    ED Course/ Medical Decision Making/ A&P                           Medical Decision Making Amount and/or Complexity of Data Reviewed Labs: ordered.    Details: Patient is O+, no RhoGAM indicated.  Hemoglobin is normal.  WBC is normal.  hCG is 68,000 Radiology: ordered and independent interpretation performed.    Details: No IUP seen  Risk OTC drugs. Prescription drug management.   Patient appears to have miscarriage.  With her current vaginal bleeding along with tissue seen as well as previously having an IUP on ultrasound that she was able to show me on her phone a few weeks ago, this seems consistent with spontaneous miscarriage.  Doubt ectopic pregnancy especially with unremarkable ovaries.  She is hemodynamically stable.  She was given Tylenol and feels a lot better.  We discussed likely course and follow-up with OB/GYN if not improving or symptoms worsen.  Otherwise she appears stable for discharge.        Final Clinical Impression(s) / ED Diagnoses Final diagnoses:  Spontaneous miscarriage    Rx / DC Orders ED Discharge Orders     None         Pricilla Loveless, MD 12/02/21  2353

## 2021-12-02 NOTE — ED Triage Notes (Signed)
Pt states that she is [redacted] weeks pregnant and reports vaginal bleeding x1 hour. Pt states that she felt mild cramping last night.

## 2021-12-02 NOTE — Discharge Instructions (Signed)
If you develop worsening, continued, or recurrent abdominal pain, uncontrolled vomiting, fever, chest or back pain, or any other new/concerning symptoms then return to the ER for evaluation.  

## 2021-12-04 LAB — GC/CHLAMYDIA PROBE AMP (~~LOC~~) NOT AT ARMC
Chlamydia: NEGATIVE
Comment: NEGATIVE
Comment: NORMAL
Neisseria Gonorrhea: NEGATIVE

## 2022-01-07 ENCOUNTER — Other Ambulatory Visit: Payer: Self-pay

## 2022-01-07 ENCOUNTER — Encounter (HOSPITAL_BASED_OUTPATIENT_CLINIC_OR_DEPARTMENT_OTHER): Payer: Self-pay

## 2022-01-07 DIAGNOSIS — R109 Unspecified abdominal pain: Secondary | ICD-10-CM | POA: Insufficient documentation

## 2022-01-07 DIAGNOSIS — J029 Acute pharyngitis, unspecified: Secondary | ICD-10-CM | POA: Insufficient documentation

## 2022-01-07 LAB — URINALYSIS, ROUTINE W REFLEX MICROSCOPIC
Bilirubin Urine: NEGATIVE
Glucose, UA: NEGATIVE mg/dL
Ketones, ur: NEGATIVE mg/dL
Leukocytes,Ua: NEGATIVE
Nitrite: NEGATIVE
Protein, ur: NEGATIVE mg/dL
Specific Gravity, Urine: 1.012 (ref 1.005–1.030)
pH: 5.5 (ref 5.0–8.0)

## 2022-01-07 LAB — CBC
HCT: 41.5 % (ref 36.0–46.0)
Hemoglobin: 13.4 g/dL (ref 12.0–15.0)
MCH: 27.6 pg (ref 26.0–34.0)
MCHC: 32.3 g/dL (ref 30.0–36.0)
MCV: 85.6 fL (ref 80.0–100.0)
Platelets: 257 10*3/uL (ref 150–400)
RBC: 4.85 MIL/uL (ref 3.87–5.11)
RDW: 13.4 % (ref 11.5–15.5)
WBC: 8.5 10*3/uL (ref 4.0–10.5)
nRBC: 0 % (ref 0.0–0.2)

## 2022-01-07 LAB — BASIC METABOLIC PANEL
Anion gap: 9 (ref 5–15)
BUN: 15 mg/dL (ref 6–20)
CO2: 26 mmol/L (ref 22–32)
Calcium: 9.5 mg/dL (ref 8.9–10.3)
Chloride: 101 mmol/L (ref 98–111)
Creatinine, Ser: 1.16 mg/dL — ABNORMAL HIGH (ref 0.44–1.00)
GFR, Estimated: >60 mL/min (ref >60)
Glucose, Bld: 95 mg/dL (ref 70–99)
Potassium: 3.5 mmol/L (ref 3.5–5.1)
Sodium: 136 mmol/L (ref 135–145)

## 2022-01-07 LAB — GROUP A STREP BY PCR: Group A Strep by PCR: NOT DETECTED

## 2022-01-07 LAB — PREGNANCY, URINE: Preg Test, Ur: NEGATIVE

## 2022-01-07 NOTE — ED Triage Notes (Addendum)
Patient here POV from Home.  Endorses Worsening Right Flank Pain that began approximately 7-8 hours ago. No Dysuria but endorses Frequency. No Confirmed Hematuria.   No Fevers. No N/V/D. Also notes a Sore Throat with associated Lymphadenopathy that began 3 days ago.   NAD Noted during Triage. A&Ox4. GCS 15. Ambulatory.

## 2022-01-08 ENCOUNTER — Emergency Department (HOSPITAL_BASED_OUTPATIENT_CLINIC_OR_DEPARTMENT_OTHER): Payer: Medicaid Other

## 2022-01-08 ENCOUNTER — Emergency Department (HOSPITAL_BASED_OUTPATIENT_CLINIC_OR_DEPARTMENT_OTHER)
Admission: EM | Admit: 2022-01-08 | Discharge: 2022-01-08 | Disposition: A | Discharge status: Home / Self Care | Payer: Medicaid Other | Attending: Emergency Medicine | Admitting: Emergency Medicine

## 2022-01-08 DIAGNOSIS — R109 Unspecified abdominal pain: Secondary | ICD-10-CM

## 2022-01-08 DIAGNOSIS — J029 Acute pharyngitis, unspecified: Secondary | ICD-10-CM

## 2022-01-08 MED ORDER — NAPROXEN 500 MG PO TABS: 500.0000 mg | ORAL_TABLET | Freq: Two times a day (BID) | ORAL | 0 refills | Status: DC

## 2022-01-08 MED ORDER — NAPROXEN 250 MG PO TABS
500.0000 mg | ORAL_TABLET | Freq: Once | ORAL | Status: AC
  Administered 2022-01-08: 500 mg via ORAL
  Filled 2022-01-08: qty 2

## 2022-01-08 NOTE — ED Notes (Addendum)
Right sided pain flank pain that started around 2pm yesterday.  "I was at work busy and holding my pee and when I peed it started hurting."  "It hurts most when I take a deep breath or move."    Reports she has had UTI in the past but it does not hurt when I pee.... no burning."

## 2022-01-08 NOTE — ED Notes (Signed)
Pt back from study.

## 2022-01-08 NOTE — ED Provider Notes (Signed)
[facility] EMERGENCY DEPT  Provider Note  CSN: [id number] Arrival date & time: 01/07/22 2201  History Chief Complaint  Patient presents with   Flank Pain    Jessica Harrington is a 31 y.o. female with no significant PMH reports onset of R flank pain around 2pm when she was 'holding her pee'. She reports pain continued after she urinated, but has improved while waiting to be seen here. She denies any falls or injuries. No dysuria or hematuria, although she is on her menses now and is unsure. She denies any fever or vomiting. She has had a sore throat and 'swollen glands' for a few days.    Home Medications Prior to Admission medications   Medication Sig Start Date End Date Taking? Authorizing Provider  naproxen (NAPROSYN) 500 MG tablet Take 1 tablet (500 mg total) by mouth 2 (two) times daily. 01/08/22  Yes Pollyann Savoy, MD  ondansetron (ZOFRAN-ODT) 4 MG disintegrating tablet Dissolve 1 tablet under the tongue every 8 (eight) hours as needed for nausea or vomiting. 06/16/21   Vanetta Mulders, MD  phentermine (ADIPEX-P) 37.5 MG tablet Take 37.5 mg by mouth daily. 05/31/21   [provider]     Allergies    Patient has no known allergies.   Review of Systems   Review of Systems Please see HPI for pertinent positives and negatives  Physical Exam BP 131/75 (BP Location: Right Arm)   Pulse 95   Temp 98.3 F (36.8 C) (Oral)   Resp 18   Ht 5\' 6"  (1.676 m)   Wt 95.7 kg   LMP 10/16/2021   SpO2 100%   Breastfeeding Unknown   BMI 34.05 kg/m   Physical Exam Vitals and nursing note reviewed.  Constitutional:      Appearance: Normal appearance.  HENT:     Head: Normocephalic and atraumatic.     Nose: Nose normal.     Mouth/Throat:     Mouth: Mucous membranes are moist.  Eyes:     Extraocular Movements: Extraocular movements intact.     Conjunctiva/sclera: Conjunctivae normal.  Cardiovascular:     Rate and Rhythm: Normal rate.  Pulmonary:      Effort: Pulmonary effort is normal.     Breath sounds: Normal breath sounds.  Abdominal:     General: Abdomen is flat.     Palpations: Abdomen is soft.     Tenderness: There is no abdominal tenderness.  Musculoskeletal:        General: Tenderness (R flank) present. No swelling. Normal range of motion.     Cervical back: Neck supple.  Lymphadenopathy:     Cervical: No cervical adenopathy.  Skin:    General: Skin is warm and dry.     Findings: No rash.  Neurological:     General: No focal deficit present.     Mental Status: She is alert.  Psychiatric:        Mood and Affect: Mood normal.     ED Results / Procedures / Treatments   EKG None  Procedures Procedures  Medications Ordered in the ED Medications  naproxen (NAPROSYN) tablet 500 mg (has no administration in time range)    Initial Impression and Plan  Patient here with R flank pain for a few hours. Labs done in triage show normal CBC, BMP. UA with some blood but she is on her menses so may be a contaminate. Strep is neg with regards to her secondary complaint. Will send for CT to eval renal  stone.   ED Course   Clinical Course as of 01/08/22 0410  Fri Jan 08, 2022  0409 I personally viewed the images from radiology studies and agree with radiologist interpretation: CT is neg. No clear explanation for pain but does not appear to be an infectious or obstructive kidney process. Recommend Naprosyn for pain. PCP follow up.  [CS]    Clinical Course User Index [CS] Pollyann Savoy, MD     MDM Rules/Calculators/A&P Medical Decision Making Problems Addressed: Flank pain: acute illness or injury Sore throat: acute illness or injury  Amount and/or Complexity of Data Reviewed Labs: ordered. Decision-making details documented in ED Course. Radiology: ordered and independent interpretation performed. Decision-making details documented in ED Course.  Risk Prescription drug management.    Final Clinical  Impression(s) / ED Diagnoses Final diagnoses:  Flank pain  Sore throat    Rx / DC Orders ED Discharge Orders          Ordered    naproxen (NAPROSYN) 500 MG tablet  2 times daily        01/08/22 0410             Pollyann Savoy, MD 01/08/22 [phone]

## 2022-12-11 IMAGING — CT CT HEAD W/O CM
4 series · 16 of 47 positions shown, 18 images · non-contrast
Comparison: None Available.

CLINICAL DATA: Headache.



[Series 2: head wo · axial · 0.41mm/px · z∈[-113,-3]mm · 7 of 30 slices shown, 9 images]
[im 4/30  brain]
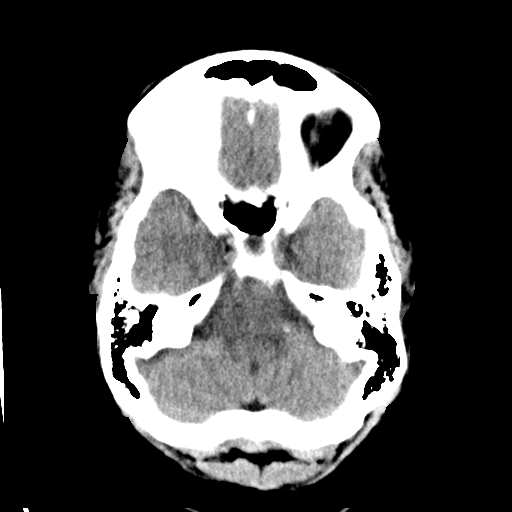
[im 4/30  bone]
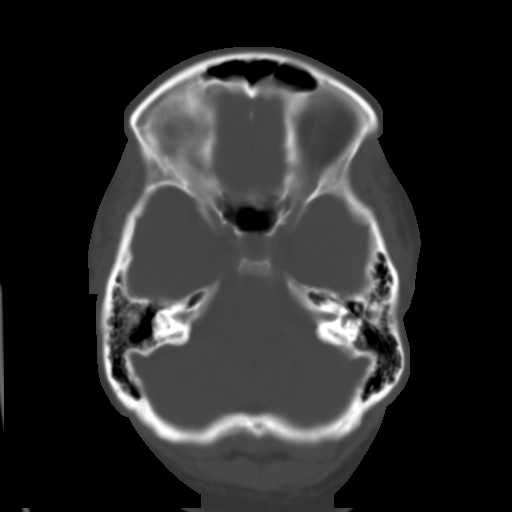
[im 8/30  brain]
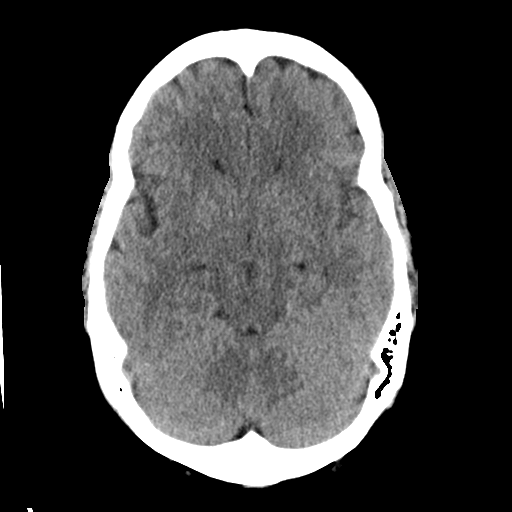
[im 11/30  brain]
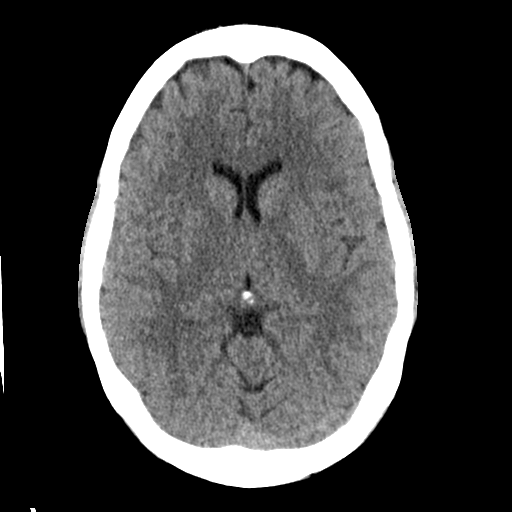
[im 15/30  brain]
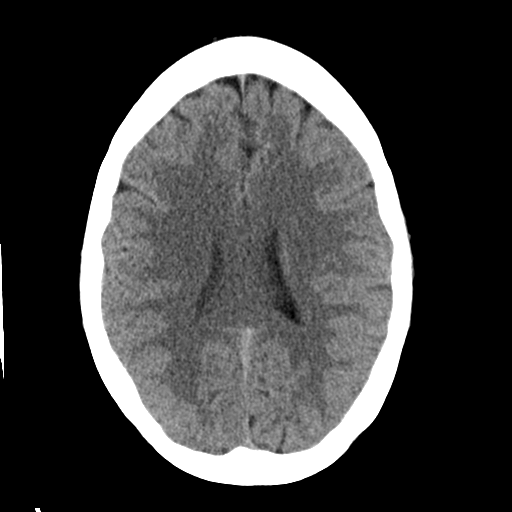
[im 19/30  brain]
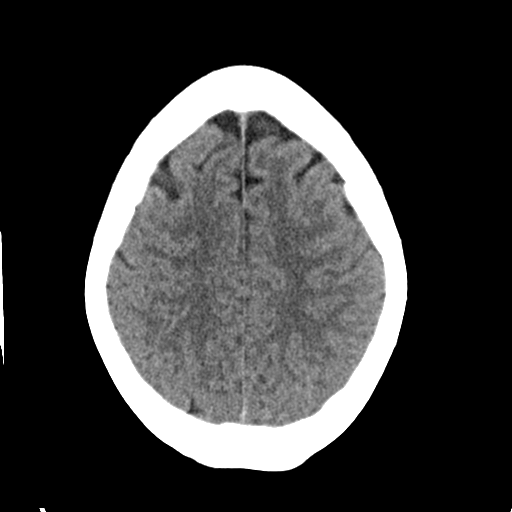
[im 19/30  bone]
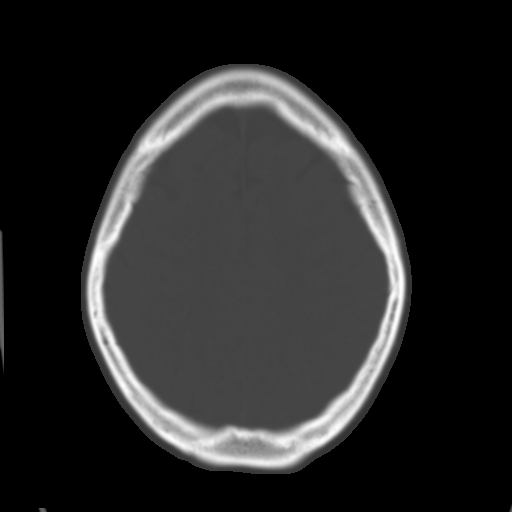
[im 22/30  brain]
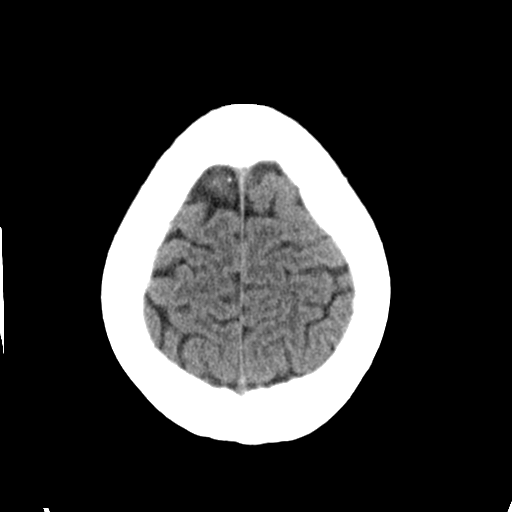
[im 26/30  brain]
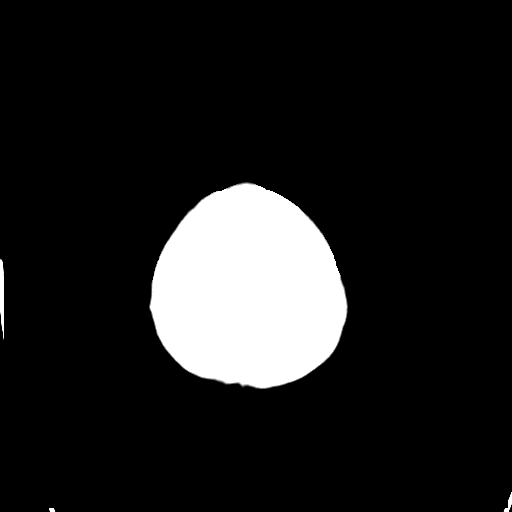

[Series 3: head bone · axial · 0.41mm/px · z∈[-114,-84]mm · 3 of 75 slices shown]
[im 8/75  bone]
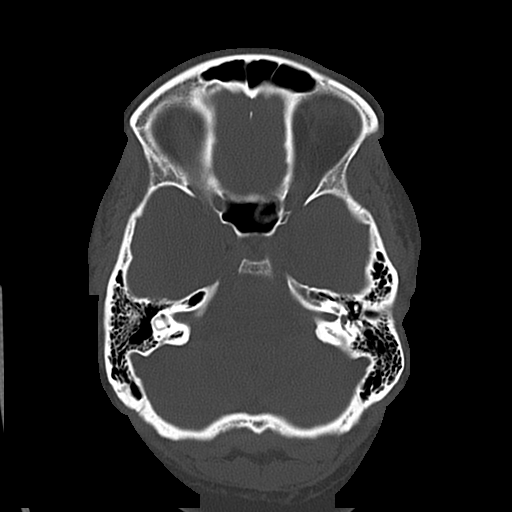
[im 15/75  bone]
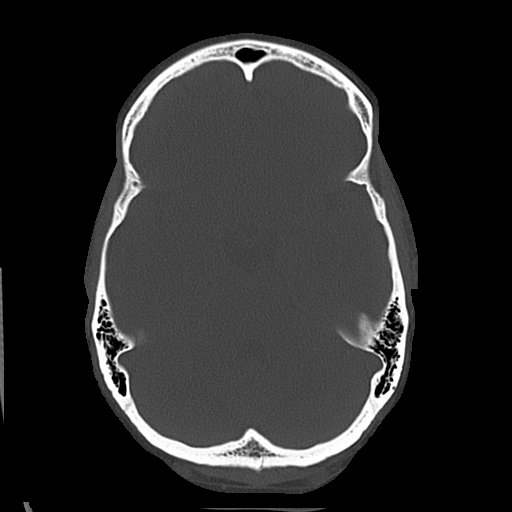
[im 23/75  bone]
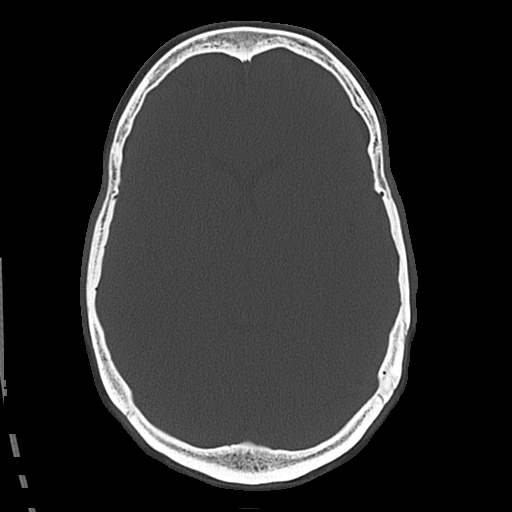

[Series 4: coronal soft · coronal · 0.31mm/px · 3 of 67 slices shown]
[im 23/67  brain]
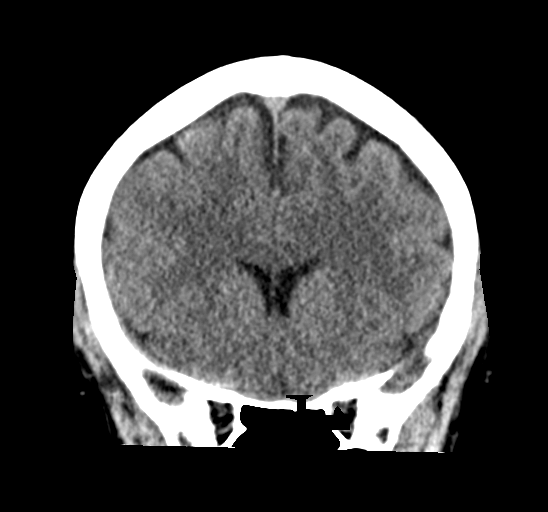
[im 30/67  brain]
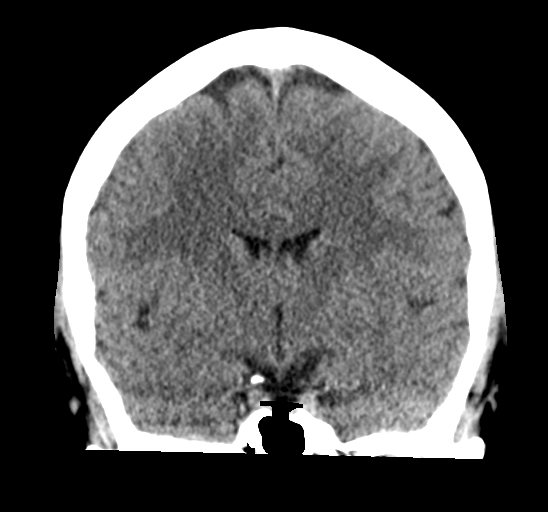
[im 37/67  brain]
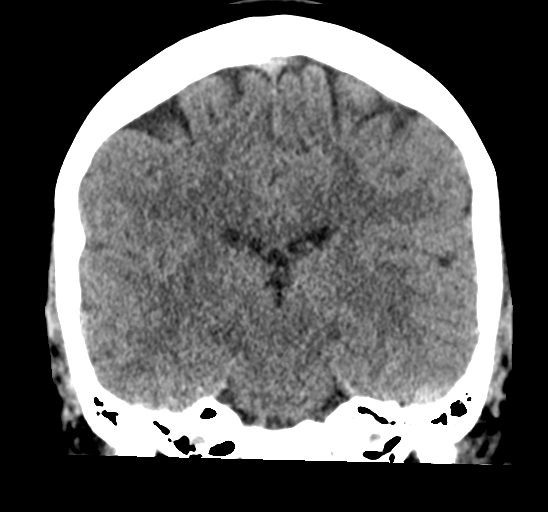

[Series 5: sagittal soft · sagittal · 0.31mm/px · 3 of 56 slices shown]
[im 19/56  brain]
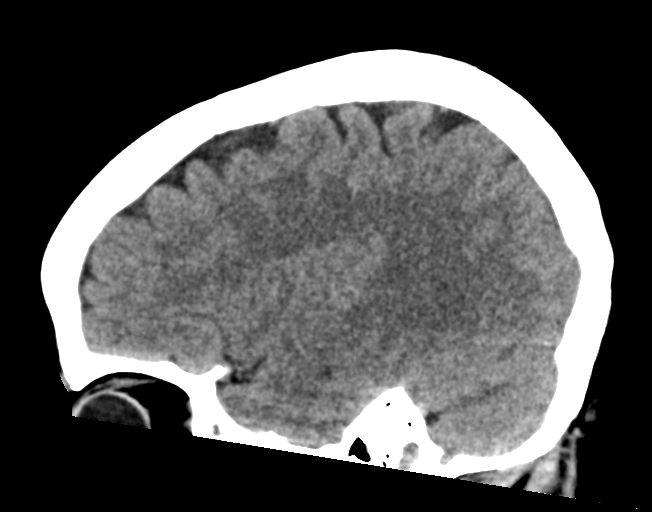
[im 28/56  brain]
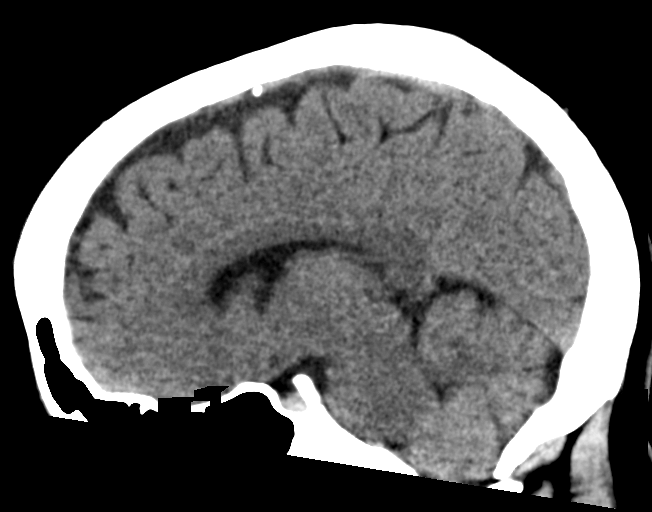
[im 37/56  brain]
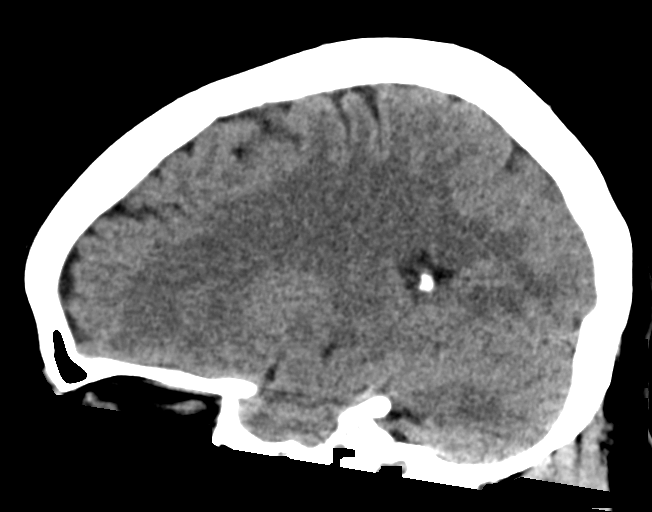

[16 of 47 positions shown; findings below may reference images not displayed]

FINDINGS: Brain: There is no evidence of an acute infarct, intracranial
hemorrhage, mass, midline shift, or extra-axial fluid collection.
The ventricles and sulci are normal.

Vascular: No hyperdense vessel.

Skull: No fracture or suspicious osseous lesion.

Sinuses/Orbits: Visualized paranasal sinuses and mastoid air cells
are clear. Visualized orbits are unremarkable.

Other: None.
IMPRESSION: Negative head CT.

## 2023-02-27 ENCOUNTER — Emergency Department (HOSPITAL_COMMUNITY)
Admission: EM | Admit: 2023-02-27 | Discharge: 2023-02-27 | Disposition: A | Discharge status: Home / Self Care | Payer: Medicaid Other | Attending: Emergency Medicine | Admitting: Emergency Medicine

## 2023-02-27 ENCOUNTER — Encounter (HOSPITAL_COMMUNITY): Payer: Self-pay | Admitting: *Deleted

## 2023-02-27 ENCOUNTER — Other Ambulatory Visit: Payer: Self-pay

## 2023-02-27 DIAGNOSIS — N760 Acute vaginitis: Secondary | ICD-10-CM | POA: Diagnosis not present

## 2023-02-27 DIAGNOSIS — R519 Headache, unspecified: Secondary | ICD-10-CM | POA: Diagnosis present

## 2023-02-27 DIAGNOSIS — G43909 Migraine, unspecified, not intractable, without status migrainosus: Secondary | ICD-10-CM | POA: Insufficient documentation

## 2023-02-27 DIAGNOSIS — Z20822 Contact with and (suspected) exposure to covid-19: Secondary | ICD-10-CM | POA: Diagnosis not present

## 2023-02-27 DIAGNOSIS — B9689 Other specified bacterial agents as the cause of diseases classified elsewhere: Secondary | ICD-10-CM

## 2023-02-27 LAB — CBC WITH DIFFERENTIAL/PLATELET
Abs Immature Granulocytes: 0.01 10*3/uL (ref 0.00–0.07)
Basophils Absolute: 0 10*3/uL (ref 0.0–0.1)
Basophils Relative: 0 %
Eosinophils Absolute: 0 10*3/uL (ref 0.0–0.5)
Eosinophils Relative: 1 %
HCT: 41.7 % (ref 36.0–46.0)
Hemoglobin: 13.1 g/dL (ref 12.0–15.0)
Immature Granulocytes: 0 %
Lymphocytes Relative: 20 %
Lymphs Abs: 1.2 10*3/uL (ref 0.7–4.0)
MCH: 27.1 pg (ref 26.0–34.0)
MCHC: 31.4 g/dL (ref 30.0–36.0)
MCV: 86.3 fL (ref 80.0–100.0)
Monocytes Absolute: 0.4 10*3/uL (ref 0.1–1.0)
Monocytes Relative: 7 %
Neutro Abs: 4 10*3/uL (ref 1.7–7.7)
Neutrophils Relative %: 72 %
Platelets: 254 10*3/uL (ref 150–400)
RBC: 4.83 MIL/uL (ref 3.87–5.11)
RDW: 13.2 % (ref 11.5–15.5)
WBC: 5.7 10*3/uL (ref 4.0–10.5)
nRBC: 0 % (ref 0.0–0.2)

## 2023-02-27 LAB — URINALYSIS, ROUTINE W REFLEX MICROSCOPIC
Bacteria, UA: NONE SEEN
Bilirubin Urine: NEGATIVE
Glucose, UA: NEGATIVE mg/dL
Ketones, ur: NEGATIVE mg/dL
Leukocytes,Ua: NEGATIVE
Nitrite: NEGATIVE
Protein, ur: NEGATIVE mg/dL
Specific Gravity, Urine: 1.018 (ref 1.005–1.030)
pH: 6 (ref 5.0–8.0)

## 2023-02-27 LAB — RESP PANEL BY RT-PCR (RSV, FLU A&B, COVID)  RVPGX2
Influenza A by PCR: NEGATIVE
Influenza B by PCR: NEGATIVE
Resp Syncytial Virus by PCR: NEGATIVE
SARS Coronavirus 2 by RT PCR: NEGATIVE

## 2023-02-27 LAB — BASIC METABOLIC PANEL
Anion gap: 7 (ref 5–15)
BUN: 14 mg/dL (ref 6–20)
CO2: 25 mmol/L (ref 22–32)
Calcium: 9.2 mg/dL (ref 8.9–10.3)
Chloride: 104 mmol/L (ref 98–111)
Creatinine, Ser: 0.85 mg/dL (ref 0.44–1.00)
GFR, Estimated: >60 mL/min (ref >60)
Glucose, Bld: 92 mg/dL (ref 70–99)
Potassium: 4 mmol/L (ref 3.5–5.1)
Sodium: 136 mmol/L (ref 135–145)

## 2023-02-27 LAB — WET PREP, GENITAL
Sperm: NONE SEEN
Trich, Wet Prep: NONE SEEN
WBC, Wet Prep HPF POC: <10 (ref <10)
Yeast Wet Prep HPF POC: NONE SEEN

## 2023-02-27 LAB — PREGNANCY, URINE: Preg Test, Ur: NEGATIVE

## 2023-02-27 MED ORDER — KETOROLAC TROMETHAMINE 15 MG/ML IJ SOLN
15.0000 mg | Freq: Once | INTRAMUSCULAR | Status: AC
  Administered 2023-02-27: 15 mg via INTRAVENOUS
  Filled 2023-02-27: qty 1

## 2023-02-27 MED ORDER — SODIUM CHLORIDE 0.9 % IV BOLUS
1000.0000 mL | Freq: Once | INTRAVENOUS | Status: AC
  Administered 2023-02-27: 1000 mL via INTRAVENOUS

## 2023-02-27 MED ORDER — METRONIDAZOLE 500 MG PO TABS: 500.0000 mg | ORAL_TABLET | Freq: Two times a day (BID) | ORAL | 0 refills | Status: DC

## 2023-02-27 MED ORDER — DIPHENHYDRAMINE HCL 50 MG/ML IJ SOLN
50.0000 mg | Freq: Once | INTRAMUSCULAR | Status: AC
  Administered 2023-02-27: 50 mg via INTRAVENOUS
  Filled 2023-02-27: qty 1

## 2023-02-27 MED ORDER — METOCLOPRAMIDE HCL 5 MG/ML IJ SOLN
10.0000 mg | Freq: Once | INTRAMUSCULAR | Status: AC
  Administered 2023-02-27: 10 mg via INTRAVENOUS
  Filled 2023-02-27: qty 2

## 2023-02-27 MED ORDER — FLUCONAZOLE 150 MG PO TABS: 150.0000 mg | ORAL_TABLET | Freq: Every day | ORAL | 0 refills | Status: AC

## 2023-02-27 NOTE — ED Provider Notes (Signed)
[place] EMERGENCY DEPARTMENT AT [facility] Provider Note   CSN: [id number] Arrival date & time: 02/27/23  1021     History  Chief Complaint  Patient presents with   Headache    Jessica Harrington is a 33 y.o. female.  33 year old female with prior medical history as detailed below presents for evaluation.  Patient complains of multiple issues including mild headache, blurry vision, dizziness, malaise, possible yeast infection.  Patient denies subjective fever.  Patient denies chest pain, shortness of breath, cough, congestion.  She took some Tylenol earlier today with some improvement in her symptoms.  The history is provided by the patient and medical records.       Home Medications Prior to Admission medications   Medication Sig Start Date End Date Taking? Authorizing Provider  naproxen (NAPROSYN) 500 MG tablet Take 1 tablet (500 mg total) by mouth 2 (two) times daily. 01/08/22   Pollyann Savoy, MD  ondansetron (ZOFRAN-ODT) 4 MG disintegrating tablet Dissolve 1 tablet under the tongue every 8 (eight) hours as needed for nausea or vomiting. 06/16/21   Vanetta Mulders, MD  phentermine (ADIPEX-P) 37.5 MG tablet Take 37.5 mg by mouth daily. 05/31/21   [provider]      Allergies    Patient has no known allergies.    Review of Systems   Review of Systems  All other systems reviewed and are negative.   Physical Exam Updated Vital Signs BP 122/65   Pulse 65   Temp 98.7 F (37.1 C) (Oral)   Resp 16   Ht 5\' 6"  (1.676 m)   Wt 98.9 kg   LMP 02/16/2023 (Approximate)   SpO2 99%   BMI 35.19 kg/m  Physical Exam Vitals and nursing note reviewed.  Constitutional:      General: She is not in acute distress.    Appearance: Normal appearance. She is well-developed.  HENT:     Head: Normocephalic and atraumatic.  Eyes:     Conjunctiva/sclera: Conjunctivae normal.     Pupils: Pupils are equal, round, and reactive to light.  Cardiovascular:      Rate and Rhythm: Normal rate and regular rhythm.     Heart sounds: Normal heart sounds.  Pulmonary:     Effort: Pulmonary effort is normal. No respiratory distress.     Breath sounds: Normal breath sounds.  Abdominal:     General: There is no distension.     Palpations: Abdomen is soft.     Tenderness: There is no abdominal tenderness.  Genitourinary:    Comments: Declines pelvic exam - prefers self swab  Musculoskeletal:        General: No deformity. Normal range of motion.     Cervical back: Normal range of motion and neck supple.  Skin:    General: Skin is warm and dry.  Neurological:     General: No focal deficit present.     Mental Status: She is alert and oriented to person, place, and time.     ED Results / Procedures / Treatments   Labs (all labs ordered are listed, but only abnormal results are displayed) Labs Reviewed  URINALYSIS, ROUTINE W REFLEX MICROSCOPIC - Abnormal; Notable for the following components:      Result Value   Hgb urine dipstick SMALL (*)    All other components within normal limits  WET PREP, GENITAL  RESP PANEL BY RT-PCR (RSV, FLU A&B, COVID)  RVPGX2  PREGNANCY, URINE  CBC WITH DIFFERENTIAL/PLATELET  BASIC METABOLIC PANEL  HCG, SERUM, QUALITATIVE    EKG None  Radiology No results found.  Procedures Procedures    Medications Ordered in ED Medications  sodium chloride 0.9 % bolus 1,000 mL (has no administration in time range)  ketorolac (TORADOL) 15 MG/ML injection 15 mg (has no administration in time range)  metoCLOPramide (REGLAN) injection 10 mg (has no administration in time range)  diphenhydrAMINE (BENADRYL) injection 50 mg (has no administration in time range)    ED Course/ Medical Decision Making/ A&P                                 Medical Decision Making Amount and/or Complexity of Data Reviewed Labs: ordered.  Risk Prescription drug management.    Medical Screen Complete  This patient presented to the  ED with complaint of headache, body aches, malaise.  This complaint involves an extensive number of treatment options. The initial differential diagnosis includes, but is not limited to, viral infection, migraine, metabolic abnormality, etc.  This presentation is: Acute, Self-Limited, Previously Undiagnosed, Uncertain Prognosis, and Systemic Symptoms  Patient is presenting with multiple complaints.  Patient is in no acute distress and does appear to be nontoxic.  Screening labs obtained are notable without significant abnormality.   Patient feels much improved after treatment for likely migraine.  Patient's self swab for wet prep is positive for clue cells.  Patient reports symptoms possibly consistent with early BV.  Patient feels improved after ED evaluation.  She understands need for close outpatient follow-up.  Strict return precautions were given and understood.  Additional history obtained: External records from outside sources obtained and reviewed including prior ED visits and prior Inpatient records.    Lab Tests:  I ordered and personally interpreted labs.  The pertinent results include: CBC, BMP, RSV, flu, COVID, wet prep, hcg  Cardiac Monitoring:  The patient was maintained on a cardiac monitor.  I personally viewed and interpreted the cardiac monitor which showed an underlying rhythm of: NSR   Medicines ordered:  I ordered medication including IV fluids, Benadryl, Toradol, Reglan for migraine headache Reevaluation of the patient after these medicines showed that the patient: improved  Problem List / ED Course:  Headache, BV   Reevaluation:  After the interventions noted above, I reevaluated the patient and found that they have: improved Disposition:  After consideration of the diagnostic results and the patients response to treatment, I feel that the patent would benefit from close outpatient followup.          Final Clinical Impression(s) / ED  Diagnoses Final diagnoses:  Migraine without status migrainosus, not intractable, unspecified migraine type  BV (bacterial vaginosis)    Rx / DC Orders ED Discharge Orders     None         Wynetta Fines, MD 02/27/23 2228

## 2023-02-27 NOTE — Discharge Instructions (Signed)
Return for any problem.  ?

## 2023-02-27 NOTE — ED Triage Notes (Addendum)
Hee by POV from home for multiple sx, including: HA, visual changes, lump in throat, dizzy, subjective fever, back pain, L ear tenderness. Sx onset yesterday. Pain onset lat night. No meds PTA. Alert, NAD, calm, interactive. Asking about checking for yeast infection. "HA different than usual HA". Steady gait.

## 2023-02-27 NOTE — ED Notes (Signed)
Awaiting provider, pt requesting medication for headache. [place], [place] and [place], [place] notified

## 2023-05-05 ENCOUNTER — Encounter (HOSPITAL_COMMUNITY): Payer: Self-pay | Admitting: *Deleted

## 2023-05-05 ENCOUNTER — Inpatient Hospital Stay (HOSPITAL_COMMUNITY)
Admission: AD | Admit: 2023-05-05 | Discharge: 2023-05-05 | Disposition: A | Discharge status: Home / Self Care | Attending: Obstetrics and Gynecology | Admitting: Obstetrics and Gynecology

## 2023-05-05 ENCOUNTER — Inpatient Hospital Stay (HOSPITAL_COMMUNITY)

## 2023-05-05 DIAGNOSIS — O3680X0 Pregnancy with inconclusive fetal viability, not applicable or unspecified: Secondary | ICD-10-CM | POA: Diagnosis not present

## 2023-05-05 DIAGNOSIS — N939 Abnormal uterine and vaginal bleeding, unspecified: Secondary | ICD-10-CM

## 2023-05-05 DIAGNOSIS — O209 Hemorrhage in early pregnancy, unspecified: Secondary | ICD-10-CM | POA: Diagnosis not present

## 2023-05-05 DIAGNOSIS — O26891 Other specified pregnancy related conditions, first trimester: Secondary | ICD-10-CM | POA: Diagnosis present

## 2023-05-05 DIAGNOSIS — Z3A00 Weeks of gestation of pregnancy not specified: Secondary | ICD-10-CM | POA: Diagnosis not present

## 2023-05-05 DIAGNOSIS — R7989 Other specified abnormal findings of blood chemistry: Secondary | ICD-10-CM | POA: Diagnosis not present

## 2023-05-05 LAB — URINALYSIS, ROUTINE W REFLEX MICROSCOPIC
Bacteria, UA: NONE SEEN
Bilirubin Urine: NEGATIVE
Glucose, UA: NEGATIVE mg/dL
Ketones, ur: NEGATIVE mg/dL
Leukocytes,Ua: NEGATIVE
Nitrite: NEGATIVE
Protein, ur: NEGATIVE mg/dL
Specific Gravity, Urine: 1.025 (ref 1.005–1.030)
pH: 5 (ref 5.0–8.0)

## 2023-05-05 LAB — WET PREP, GENITAL
Clue Cells Wet Prep HPF POC: NONE SEEN
Sperm: NONE SEEN
Trich, Wet Prep: NONE SEEN
WBC, Wet Prep HPF POC: <10 (ref <10)
Yeast Wet Prep HPF POC: NONE SEEN

## 2023-05-05 LAB — CBC
HCT: 40.1 % (ref 36.0–46.0)
Hemoglobin: 13.2 g/dL (ref 12.0–15.0)
MCH: 27.6 pg (ref 26.0–34.0)
MCHC: 32.9 g/dL (ref 30.0–36.0)
MCV: 83.9 fL (ref 80.0–100.0)
Platelets: 249 10*3/uL (ref 150–400)
RBC: 4.78 MIL/uL (ref 3.87–5.11)
RDW: 12.9 % (ref 11.5–15.5)
WBC: 7.7 10*3/uL (ref 4.0–10.5)
nRBC: 0 % (ref 0.0–0.2)

## 2023-05-05 LAB — HCG, QUANTITATIVE, PREGNANCY: hCG, Beta Chain, Quant, S: 185 m[IU]/mL — ABNORMAL HIGH (ref <5)

## 2023-05-05 LAB — POCT PREGNANCY, URINE: Preg Test, Ur: POSITIVE — AB

## 2023-05-05 NOTE — Discharge Instructions (Signed)
 Return to the MAU on Saturday 05/07/23 for repeat blood work Bleeding precautions Nothing inside the vagina and no sex or strenuous activity

## 2023-05-05 NOTE — MAU Provider Note (Signed)
 S Ms. Patrisha Hausmann is a 33 y.o. G4P2 patient who presents to MAU today with complaint of abdominal cramping that started on Friday (04/29/23) She reports that she had a positive UPT at home. She reported today she was at  the gym and started to have some vaginal bleeding. She reports LMP 04/02/23 with IC on 04/08/23 and took Plan B on 04/10/23.    ROS   O BP 116/61   Pulse 81   Temp 98.6 F (37 C)   Resp 18   Ht 5\' 6"  (1.676 m)   Wt 101.6 kg   LMP 04/02/2023   BMI 36.15 kg/m  Physical Exam Vitals and nursing note reviewed.       Orders Placed This Encounter  Procedures   Wet prep, genital    Standing Status:   Standing    Number of Occurrences:   1   US OB LESS THAN 14 WEEKS WITH OB TRANSVAGINAL    Standing Status:   Standing    Number of Occurrences:   1    Symptom/Reason for Exam:   Vaginal bleeding affecting early pregnancy [[id number]]   Urinalysis, Routine w reflex microscopic -Urine, Clean Catch    Standing Status:   Standing    Number of Occurrences:   1    Specimen Source:   Urine, Clean Catch [76]   hCG, quantitative, pregnancy    Standing Status:   Standing    Number of Occurrences:   1   CBC    Standing Status:   Standing    Number of Occurrences:   1   Pregnancy, urine POC    Standing Status:   Standing    Number of Occurrences:   1      Lab Results  Component Value Date   HCGBETAQNT 185 (H) 05/05/2023      Narrative & Impression  CLINICAL DATA:  Vaginal bleeding rectal pressure   EXAM: OBSTETRIC <14 WK ULTRASOUND   TECHNIQUE: Transabdominal ultrasound was performed for evaluation of the gestation as well as the maternal uterus and adnexal regions.   COMPARISON:  None Available.   FINDINGS: Intrauterine gestational sac: None   Yolk sac:  Not Visualized.   Embryo:  Not Visualized.   Maternal uterus/adnexae: Right ovary is within normal limits. The right ovary measures 3.6 x 1.8 x 2.6 cm. Left ovary measures 1.6 x 2.1 x 1.7 cm.  Within the left adnexa, separate from the left ovary is a persistent ovoid soft tissue mass measuring 2.4 x 1.6 x 1.8 cm. Also noted is a small right uterine exophytic fibroid measuring 1.6 by 2.4 x 1.8 cm. No significant free fluid   IMPRESSION: 1. No IUP identified. Persistent 2.4 cm soft tissue mass in the left adnexa, appears separate from left ovary, raising concern for an ectopic pregnancy. Recommend correlation with hCG. No free fluid. 2. Critical Value/emergent results were called by telephone at the time of interpretation on 05/05/2023 at 6:43 pm to provider Marcell Barlow , who verbally acknowledged these results.     Electronically Signed   By: Jasmine Pang M.D.   On: 05/05/2023 18:43      MDM  HIGH  CBC: NM Wet Prep negative U/A with small blood otherwise unremarkable Hcg = 185    - Concern for early pregnancy vs questionable left  ectopic pregnancy vs left ovarian cyst  d/t very early pregnancy with hcg quant at 185  - Decision is to repeat hcg quant in  48 hours and strict ectopic, bleeding precautions . This was discussed with OB Attending Dr Jolayne Panther and she agrees with repeat hcg quant on Saturday 05/07/23     I have reviewed the patient chart and performed the physical exam . I have ordered & interpreted the lab results and reviewed and interpreted the radiology report and reviewed the images  A/P as described below.  Counseling and education provided and patient agreeable  with plan as described below. Verbalized understanding.   ASSESSMENT Medical screening exam complete   1. Elevated serum human chorionic gonadotropin (hCG) level (Primary)  2. Pregnancy of unknown anatomic location  3. Vaginal bleeding     PLAN  Discharge from MAU in stable condition  Return to MAU on 05/07/23 for repeat hcg quant level as d/w Dr Jolayne Panther [facility] Attending)   Strict ectopic and bleeding precautions discussed with patient and SO at the bedside ( See AVS for full  description of all verbal and written precautions and instructions provided to the patient at time of discharge)  Warning signs for worsening condition that would warrant emergency follow-up discussed  Patient may return to MAU as needed   Colman Cater, NP 05/05/2023 7:05 PM

## 2023-05-05 NOTE — MAU Note (Signed)
.  Jessica Harrington is a 33 y.o. at Unknown here in MAU reporting: Had a positive HPT  and went to gym Friday  and started having some cramping. Happen a few more times over the weekend.  Today when she wen to the gym she had some vag bleeding. Pt stated she had a period 04/02/23.  Had intercourse  on 04/08/23 took plan B on 04/10/23.  Reports some cramping as well.  LMP: 04/02/23 Onset of complaint: Friday Pain score: 7 Vitals:   05/05/23 1640  BP: 116/61  Pulse: 81  Resp: 18  Temp: 98.6 F (37 C)     FHT: n/a  Lab orders placed from triage: UPT/ U/A, Wet prep, GC

## 2023-05-06 LAB — GC/CHLAMYDIA PROBE AMP (~~LOC~~) NOT AT ARMC
Chlamydia: NEGATIVE
Comment: NEGATIVE
Comment: NORMAL
Neisseria Gonorrhea: NEGATIVE

## 2023-05-07 ENCOUNTER — Other Ambulatory Visit: Payer: Self-pay

## 2023-05-07 ENCOUNTER — Inpatient Hospital Stay (HOSPITAL_COMMUNITY)
Admission: AD | Admit: 2023-05-07 | Discharge: 2023-05-07 | Disposition: A | Discharge status: Home / Self Care | Payer: Self-pay | Attending: Obstetrics & Gynecology | Admitting: Obstetrics & Gynecology

## 2023-05-07 DIAGNOSIS — O039 Complete or unspecified spontaneous abortion without complication: Secondary | ICD-10-CM | POA: Diagnosis not present

## 2023-05-07 DIAGNOSIS — O209 Hemorrhage in early pregnancy, unspecified: Secondary | ICD-10-CM | POA: Diagnosis present

## 2023-05-07 DIAGNOSIS — Z3A01 Less than 8 weeks gestation of pregnancy: Secondary | ICD-10-CM

## 2023-05-07 LAB — HCG, QUANTITATIVE, PREGNANCY: hCG, Beta Chain, Quant, S: 91 m[IU]/mL — ABNORMAL HIGH (ref <5)

## 2023-05-07 NOTE — MAU Provider Note (Cosign Needed Addendum)
 bHCG evaluation vs ectopic   S Ms. Jessica Harrington is a 33 y.o. G4P2 pregnant female at [redacted]w[redacted]d who presents to MAU today with complaint of recommended 48hr re-evaluation for hCG trend.  Was seen on 3/27 and hCG 185 with [place] with L adnexa ovoid soft tissue mass measuring 2.4 x 1.6 x 1.8 cm (incidental fiborid).  Pt states that since leaving she initially had worsening abdominal pain and bleeding with passage of "tiny bits of tissue" but have since went from moderate to only dark spotting today and intermittent suprapubic discomfort and TTP.  Denies F/C, N/V, PO intolerance, palpitations, CP, SOB, presyncope.    Pertinent items noted in HPI and remainder of comprehensive ROS otherwise negative.   O BP (!) 112/59 (BP Location: Left Arm)   Pulse 68   Temp 98.2 F (36.8 C) (Oral)   Resp 18   LMP 04/02/2023   SpO2 98%  Physical Exam Vitals and nursing note reviewed.  Constitutional:      General: She is not in acute distress.    Appearance: Normal appearance. She is not ill-appearing.  HENT:     Head: Normocephalic and atraumatic.     Right Ear: External ear normal.     Left Ear: External ear normal.     Nose: Nose normal. No congestion.     Mouth/Throat:     Mouth: Mucous membranes are moist.     Pharynx: Oropharynx is clear.  Eyes:     Extraocular Movements: Extraocular movements intact.     Conjunctiva/sclera: Conjunctivae normal.  Cardiovascular:     Rate and Rhythm: Normal rate.  Pulmonary:     Effort: Pulmonary effort is normal. No respiratory distress.  Abdominal:     General: Abdomen is flat. There is no distension.     Palpations: Abdomen is soft.     Tenderness: There is abdominal tenderness (mild TTP suprapubic).  Musculoskeletal:        General: No swelling. Normal range of motion.     Cervical back: Normal range of motion.  Skin:    General: Skin is warm and dry.  Neurological:     Mental Status: She is alert and oriented to person, place, and time. Mental status  is at baseline.  Psychiatric:        Mood and Affect: Mood normal.        Behavior: Behavior normal.      MDM: MAU Course:  Pending hCG for ectopic vs SAB.   hCG 185 > 91  Despite [place] concerning for ectopic, hCG level points away from that and now that down trending given above HPI more consistent with early pregnancy loss.  Adnexal mass most likely luteal cyst or other non-pathologic process. Given strict usual return precautions, stable for d/c, cleared with Dr. Charlotta Newton.   AP #Miscarriage  Discharge from MAU in stable condition with strict/usual precautions Follow up at desired OBGYN as scheduled for ongoing prenatal care  Allergies as of 05/07/2023   No Known Allergies      Medication List    You have not been prescribed any medications.     Hessie Dibble, MD 05/07/2023 3:13 PM

## 2023-05-07 NOTE — MAU Note (Signed)
 Jessica Harrington is a 33 y.o. at [redacted]w[redacted]d here in MAU reporting: dark brown vaginal spotting with some abdominal cramping. Patient states bleeding has lessened since yesterday. Patient is here for repeat bloodwork.  LMP: 04/02/2023 Onset of complaint: 05/05/2023 Pain score: 5 Vitals:   05/07/23 1343  BP: (!) 112/59  Pulse: 68  Resp: 18  Temp: 98.2 F (36.8 C)  SpO2: 98%      Lab orders placed from triage: quantitative pregnancy hCG

## 2023-07-15 ENCOUNTER — Emergency Department (HOSPITAL_BASED_OUTPATIENT_CLINIC_OR_DEPARTMENT_OTHER)

## 2023-07-15 ENCOUNTER — Emergency Department (HOSPITAL_BASED_OUTPATIENT_CLINIC_OR_DEPARTMENT_OTHER)
Admission: EM | Admit: 2023-07-15 | Discharge: 2023-07-15 | Disposition: A | Discharge status: Home / Self Care | Attending: Emergency Medicine | Admitting: Emergency Medicine

## 2023-07-15 ENCOUNTER — Encounter (HOSPITAL_BASED_OUTPATIENT_CLINIC_OR_DEPARTMENT_OTHER): Payer: Self-pay

## 2023-07-15 DIAGNOSIS — R1030 Lower abdominal pain, unspecified: Secondary | ICD-10-CM | POA: Diagnosis present

## 2023-07-15 DIAGNOSIS — F17210 Nicotine dependence, cigarettes, uncomplicated: Secondary | ICD-10-CM | POA: Insufficient documentation

## 2023-07-15 LAB — URINALYSIS, ROUTINE W REFLEX MICROSCOPIC
Bacteria, UA: NONE SEEN
Bilirubin Urine: NEGATIVE
Glucose, UA: NEGATIVE mg/dL
Ketones, ur: NEGATIVE mg/dL
Leukocytes,Ua: NEGATIVE
Nitrite: NEGATIVE
Protein, ur: NEGATIVE mg/dL
Specific Gravity, Urine: 1.02 (ref 1.005–1.030)
pH: 7 (ref 5.0–8.0)

## 2023-07-15 LAB — COMPREHENSIVE METABOLIC PANEL WITH GFR
ALT: 28 U/L (ref 0–44)
AST: 28 U/L (ref 15–41)
Albumin: 4.1 g/dL (ref 3.5–5.0)
Alkaline Phosphatase: 81 U/L (ref 38–126)
Anion gap: 10 (ref 5–15)
BUN: 20 mg/dL (ref 6–20)
CO2: 25 mmol/L (ref 22–32)
Calcium: 9.1 mg/dL (ref 8.9–10.3)
Chloride: 105 mmol/L (ref 98–111)
Creatinine, Ser: 1.01 mg/dL — ABNORMAL HIGH (ref 0.44–1.00)
GFR, Estimated: >60 mL/min (ref >60)
Glucose, Bld: 54 mg/dL — ABNORMAL LOW (ref 70–99)
Potassium: 3.7 mmol/L (ref 3.5–5.1)
Sodium: 140 mmol/L (ref 135–145)
Total Bilirubin: <0.2 mg/dL (ref 0.0–1.2)
Total Protein: 6.9 g/dL (ref 6.5–8.1)

## 2023-07-15 LAB — WET PREP, GENITAL
Clue Cells Wet Prep HPF POC: NONE SEEN
Sperm: NONE SEEN
Trich, Wet Prep: NONE SEEN
WBC, Wet Prep HPF POC: <10 (ref <10)
Yeast Wet Prep HPF POC: NONE SEEN

## 2023-07-15 LAB — CBC
HCT: 40.2 % (ref 36.0–46.0)
Hemoglobin: 12.9 g/dL (ref 12.0–15.0)
MCH: 27.6 pg (ref 26.0–34.0)
MCHC: 32.1 g/dL (ref 30.0–36.0)
MCV: 85.9 fL (ref 80.0–100.0)
Platelets: 248 10*3/uL (ref 150–400)
RBC: 4.68 MIL/uL (ref 3.87–5.11)
RDW: 13.2 % (ref 11.5–15.5)
WBC: 7.3 10*3/uL (ref 4.0–10.5)
nRBC: 0 % (ref 0.0–0.2)

## 2023-07-15 LAB — PREGNANCY, URINE: Preg Test, Ur: NEGATIVE

## 2023-07-15 LAB — LIPASE, BLOOD: Lipase: 21 U/L (ref 11–51)

## 2023-07-15 MED ORDER — IOHEXOL 300 MG/ML  SOLN
100.0000 mL | Freq: Once | INTRAMUSCULAR | Status: AC | PRN
  Administered 2023-07-15: 100 mL via INTRAVENOUS

## 2023-07-15 NOTE — ED Triage Notes (Signed)
 Pt c/o RLQ pain, frequent urination, "pain all over where my ovaries are- when she was pushing on my stomach it hurt, but hurt more when she let go & shot to the R side."

## 2023-07-15 NOTE — Discharge Instructions (Addendum)
 While you were in the emergency room, you had blood work done that was normal.  Your wet prep to look for vaginal infections was negative.  You will be called if your gonorrhea chlamydia test are positive.  Your CT scan was negative.  At this time, I do not have a clear cause for your symptoms.  Please follow-up with your PCP.  Return to the emergency room for any new or worsening symptoms.

## 2023-07-15 NOTE — ED Provider Notes (Signed)
 [facility] AT [facility] Provider Note  CSN: [id number] Arrival date & time: 07/15/23 1740  Chief Complaint(s) Abdominal Pain  HPI Jessica Harrington is a 33 y.o. female who is here today for lower abdominal pain.  Patient reports that symptoms have been ongoing for last couple of weeks since she returned from a trip to [place].  She has not any diarrhea or fever.  She endorses some vague crampy abdominal pain.  Patient has also seen her PCP, who prescribed her medication for candidiasis, although patient denies any vaginal discharge.  She was seen at [facility] today and they were concerned about appendicitis so they sent her to the ED.   Past Medical History Past Medical History:  Diagnosis Date   Hyperthyroidism    There are no active problems to display for this patient.  Home Medication(s) Prior to Admission medications   Not on File                                                                                                                                    Past Surgical History Past Surgical History:  Procedure Laterality Date   CHOLECYSTECTOMY     Family History History reviewed. No pertinent family history.  Social History Social History   Tobacco Use   Smoking status: Every Day    Current packs/day: 1.00    Types: Cigarettes   Smokeless tobacco: Never  Substance Use Topics   Alcohol use: Not Currently   Drug use: Not Currently   Allergies Patient has no known allergies.  Review of Systems Review of Systems  Physical Exam Vital Signs  I have reviewed the triage vital signs BP 90/61 (BP Location: Right Arm)   Pulse 71   Temp 98 F (36.7 C) (Oral)   Resp 14   LMP 06/27/2023 (Approximate)   SpO2 100%   Breastfeeding Unknown   Physical Exam Vitals and nursing note reviewed.  Constitutional:      General: She is not in acute distress.    Appearance: She is not toxic-appearing.  Abdominal:      General: Abdomen is flat. Bowel sounds are normal.     Tenderness: There is no abdominal tenderness. There is no right CVA tenderness, left CVA tenderness, guarding or rebound. Negative signs include Murphy's sign, McBurney's sign and obturator sign.     Hernia: No hernia is present.  Skin:    General: Skin is warm and dry.  Neurological:     Mental Status: She is alert.     ED Results and Treatments Labs (all labs ordered are listed, but only abnormal results are displayed) Labs Reviewed  COMPREHENSIVE METABOLIC PANEL WITH GFR - Abnormal; Notable for the following components:      Result Value   Glucose, Bld 54 (*)    Creatinine, Ser 1.01 (*)    All other components within normal limits  URINALYSIS, ROUTINE  W REFLEX MICROSCOPIC - Abnormal; Notable for the following components:   Color, Urine COLORLESS (*)    Hgb urine dipstick TRACE (*)    All other components within normal limits  WET PREP, GENITAL  LIPASE, BLOOD  CBC  PREGNANCY, URINE  GC/CHLAMYDIA PROBE AMP ([place]) NOT AT [facility]                                                                                                                          Radiology CT ABDOMEN PELVIS W CONTRAST Result Date: 07/15/2023 CLINICAL DATA:  Right lower quadrant pain EXAM: CT ABDOMEN AND PELVIS WITH CONTRAST TECHNIQUE: Multidetector CT imaging of the abdomen and pelvis was performed using the standard protocol following bolus administration of intravenous contrast. RADIATION DOSE REDUCTION: This exam was performed according to the departmental dose-optimization program which includes automated exposure control, adjustment of the mA and/or kV according to patient size and/or use of iterative reconstruction technique. CONTRAST:  OMNIPAQUE  IOHEXOL  300 MG/ML  SOLN COMPARISON:  None Available. FINDINGS: Lower chest: No acute abnormality. Hepatobiliary: No focal liver abnormality is seen. Status post cholecystectomy. No biliary dilatation.  Pancreas: Unremarkable. No pancreatic ductal dilatation or surrounding inflammatory changes. Spleen: Normal in size without focal abnormality. Adrenals/Urinary Tract: Adrenal glands are within normal limits. Kidneys demonstrate a normal enhancement pattern bilaterally. No calculi or obstructive changes are seen. The bladder is decompressed. Stomach/Bowel: The appendix is well visualized and within normal limits. No obstructive or inflammatory changes of the colon are seen. Small bowel and stomach are within normal limits. Vascular/Lymphatic: No significant vascular findings are present. No enlarged abdominal or pelvic lymph nodes. Reproductive: Uterus and bilateral adnexa are unremarkable. Other: No abdominal wall hernia or abnormality. No abdominopelvic ascites. Musculoskeletal: No acute or significant osseous findings. IMPRESSION: Normal-appearing appendix. No other focal abnormality is noted. Electronically Signed   By: Violeta Grey M.D.   On: 07/15/2023 20:43    Pertinent labs & imaging results that were available during my care of the patient were reviewed by me and considered in my medical decision making (see MDM for details).  Medications Ordered in ED Medications  iohexol  (OMNIPAQUE ) 300 MG/ML solution 100 mL (100 mLs Intravenous Contrast Given 07/15/23 2033)  Procedures Procedures  (including critical care time)  Medical Decision Making / ED Course   This patient presents to the ED for concern of lower abdominal pain, this involves an extensive number of treatment options, and is a complaint that carries with it a high risk of complications and morbidity.  The differential diagnosis includes colitis, enteritis, abdominal pain, less likely appendicitis, less likely torsion, less likely cystitis, BV.  MDM: Patient overall well-appearing on exam.  She has no  significant abdominal tenderness that I was able to elicit.  Vital signs overall reassuring.  Given the earlier concern for appendicitis, will obtain CT imaging.  Blood work checked.  While the patient does self swab on a wet prep.  Reassessment 9:15 PM-patient's urinalysis negative.  Blood work overall normal.  Nothing on the wet prep.  CT imaging negative.  You Prag negative.  Clinically, patient does not appear to have a torsion.  Will discharge patient have her follow-up with her PCP.   Additional history obtained: -Additional history obtained from nurse practitioner at [facility] medical -External records from outside source obtained and reviewed including: Chart review including previous notes, labs, imaging, consultation notes   Lab Tests: -I ordered, reviewed, and interpreted labs.   The pertinent results include:   Labs Reviewed  COMPREHENSIVE METABOLIC PANEL WITH GFR - Abnormal; Notable for the following components:      Result Value   Glucose, Bld 54 (*)    Creatinine, Ser 1.01 (*)    All other components within normal limits  URINALYSIS, ROUTINE W REFLEX MICROSCOPIC - Abnormal; Notable for the following components:   Color, Urine COLORLESS (*)    Hgb urine dipstick TRACE (*)    All other components within normal limits  WET PREP, GENITAL  LIPASE, BLOOD  CBC  PREGNANCY, URINE  GC/CHLAMYDIA PROBE AMP ([place]) NOT AT [facility]        Imaging Studies ordered: I ordered imaging studies including CT imaging of the abdomen pelvis I independently visualized and interpreted imaging. I agree with the radiologist interpretation   Medicines ordered and prescription drug management: Meds ordered this encounter  Medications   iohexol  (OMNIPAQUE ) 300 MG/ML solution 100 mL    -I have reviewed the patients home medicines and have made adjustments as needed  Cardiac Monitoring: The patient was maintained on a cardiac monitor.  I personally viewed and interpreted the cardiac  monitored which showed an underlying rhythm of: Normal sinus rhythm  Social Determinants of Health:  Factors impacting patients care include: Lack of access to primary care   Reevaluation: After the interventions noted above, I reevaluated the patient and found that they have :improved  Co morbidities that complicate the patient evaluation  Past Medical History:  Diagnosis Date   Hyperthyroidism       Dispostion: I considered admission for this patient, however with her reassuring workup, she is appropriate for outpatient follow-up.     Final Clinical Impression(s) / ED Diagnoses Final diagnoses:  Lower abdominal pain     @PCDICTATION @    Afton Horse T, DO 07/15/23 2122

## 2023-07-18 LAB — GC/CHLAMYDIA PROBE AMP (~~LOC~~) NOT AT ARMC
Chlamydia: NEGATIVE
Comment: NEGATIVE
Comment: NORMAL
Neisseria Gonorrhea: NEGATIVE
